# Patient Record
Sex: Female | Born: 1975
Health system: Southern US, Community
[De-identification: ages and names within clinical notes are randomized; demographics above are authoritative.]

## PROBLEM LIST (undated history)

## (undated) ENCOUNTER — Inpatient Hospital Stay (HOSPITAL_COMMUNITY): Payer: Self-pay

## (undated) DIAGNOSIS — N6019 Diffuse cystic mastopathy of unspecified breast: Secondary | ICD-10-CM

## (undated) DIAGNOSIS — F329 Major depressive disorder, single episode, unspecified: Secondary | ICD-10-CM

## (undated) DIAGNOSIS — S025XXA Fracture of tooth (traumatic), initial encounter for closed fracture: Secondary | ICD-10-CM

## (undated) DIAGNOSIS — Z550 Illiteracy and low-level literacy: Secondary | ICD-10-CM

## (undated) DIAGNOSIS — F32A Depression, unspecified: Secondary | ICD-10-CM

## (undated) DIAGNOSIS — R252 Cramp and spasm: Secondary | ICD-10-CM

## (undated) HISTORY — DX: Illiteracy and low-level literacy: Z55.0

## (undated) HISTORY — DX: Major depressive disorder, single episode, unspecified: F32.9

## (undated) HISTORY — PX: UMBILICAL HERNIA REPAIR: SHX196

## (undated) HISTORY — DX: Depression, unspecified: F32.A

---

## 2003-09-11 ENCOUNTER — Inpatient Hospital Stay (HOSPITAL_COMMUNITY): Admission: AD | Admit: 2003-09-11 | Discharge: 2003-09-11 | Payer: Self-pay | Admitting: Obstetrics and Gynecology

## 2003-09-11 ENCOUNTER — Encounter (INDEPENDENT_AMBULATORY_CARE_PROVIDER_SITE_OTHER): Payer: Self-pay | Admitting: *Deleted

## 2004-02-12 HISTORY — PX: CHOLECYSTECTOMY: SHX55

## 2004-05-11 ENCOUNTER — Ambulatory Visit: Payer: Self-pay | Admitting: Internal Medicine

## 2004-07-08 ENCOUNTER — Inpatient Hospital Stay (HOSPITAL_COMMUNITY): Admission: AD | Admit: 2004-07-08 | Discharge: 2004-07-09 | Payer: Self-pay | Admitting: Obstetrics & Gynecology

## 2004-07-14 ENCOUNTER — Inpatient Hospital Stay (HOSPITAL_COMMUNITY): Admission: AD | Admit: 2004-07-14 | Discharge: 2004-07-14 | Payer: Self-pay | Admitting: Obstetrics

## 2004-07-15 ENCOUNTER — Inpatient Hospital Stay (HOSPITAL_COMMUNITY): Admission: AD | Admit: 2004-07-15 | Discharge: 2004-07-18 | Payer: Self-pay | Admitting: Obstetrics

## 2004-07-20 ENCOUNTER — Inpatient Hospital Stay (HOSPITAL_COMMUNITY): Admission: AD | Admit: 2004-07-20 | Discharge: 2004-07-24 | Payer: Self-pay | Admitting: Obstetrics

## 2004-08-01 ENCOUNTER — Inpatient Hospital Stay (HOSPITAL_COMMUNITY): Admission: AD | Admit: 2004-08-01 | Discharge: 2004-08-03 | Payer: Self-pay | Admitting: Obstetrics

## 2004-08-06 ENCOUNTER — Inpatient Hospital Stay (HOSPITAL_COMMUNITY): Admission: AD | Admit: 2004-08-06 | Discharge: 2004-08-09 | Payer: Self-pay | Admitting: Obstetrics

## 2005-01-15 ENCOUNTER — Inpatient Hospital Stay (HOSPITAL_COMMUNITY): Admission: AD | Admit: 2005-01-15 | Discharge: 2005-01-17 | Payer: Self-pay | Admitting: Obstetrics

## 2005-03-31 ENCOUNTER — Inpatient Hospital Stay (HOSPITAL_COMMUNITY): Admission: AD | Admit: 2005-03-31 | Discharge: 2005-03-31 | Payer: Self-pay | Admitting: Obstetrics

## 2005-05-03 ENCOUNTER — Ambulatory Visit (HOSPITAL_COMMUNITY): Admission: RE | Admit: 2005-05-03 | Discharge: 2005-05-03 | Payer: Self-pay | Admitting: Surgery

## 2005-07-01 ENCOUNTER — Ambulatory Visit: Payer: Self-pay | Admitting: Internal Medicine

## 2005-11-06 ENCOUNTER — Ambulatory Visit (HOSPITAL_COMMUNITY): Admission: RE | Admit: 2005-11-06 | Discharge: 2005-11-06 | Payer: Self-pay | Admitting: Family Medicine

## 2006-02-25 ENCOUNTER — Inpatient Hospital Stay (HOSPITAL_COMMUNITY): Admission: AD | Admit: 2006-02-25 | Discharge: 2006-02-27 | Payer: Self-pay | Admitting: Gynecology

## 2006-02-25 ENCOUNTER — Ambulatory Visit: Payer: Self-pay | Admitting: Obstetrics & Gynecology

## 2009-03-07 ENCOUNTER — Emergency Department (HOSPITAL_COMMUNITY): Admission: EM | Admit: 2009-03-07 | Discharge: 2009-03-07 | Payer: Self-pay | Admitting: Emergency Medicine

## 2010-04-30 LAB — URINALYSIS, ROUTINE W REFLEX MICROSCOPIC
Protein, ur: NEGATIVE mg/dL
Specific Gravity, Urine: 1.017 (ref 1.005–1.030)
Urobilinogen, UA: 0.2 mg/dL (ref 0.0–1.0)
pH: 6.5 (ref 5.0–8.0)

## 2010-04-30 LAB — WET PREP, GENITAL: Yeast Wet Prep HPF POC: NONE SEEN

## 2010-04-30 LAB — URINE MICROSCOPIC-ADD ON

## 2010-06-29 NOTE — Op Note (Signed)
NAMEBRADEN, DELOACH        ACCOUNT NO.:  [id number]   MEDICAL RECORD NO.:  [id number]          PATIENT TYPE:  INP   LOCATION:  9168                          FACILITY:  [facility]   PHYSICIAN:  Kathreen Cosier, M.D.DATE OF BIRTH:  1975-09-23   DATE OF PROCEDURE:  01/15/2005  DATE OF DISCHARGE:                                 OPERATIVE REPORT   DELIVERY NOTE:  The patient is 35 year old, gravida 2, para 0-0-1-0, [facility] January 13, 2005.  Was admitted for induction.  The patient progressed rapidly and after being  fully dilated, she pushed for +5 minutes and was tired and requested help.  She pushed the vertex to +3 station.  Midline episiotomy, third degree, was  cut and the vacuum applied through one contraction and one push.  There was  no pop-off.  She then delivered from the LOA position a female, Apgars 9 and  9.  There was a small posterior extension.  Placenta was delivered  spontaneously intact.  The rectal mucosa and the anal sphincter were  repaired along with the episiotomy with 2-0 Vicryl sutures.  Post repair the  rectal mucosa and the anal sphincter were noted to be intact.  The patient  tolerated the procedure well.           ______________________________  Kathreen Cosier, M.D.     BAM/MEDQ  D:  01/15/2005  T:  01/15/2005  Job:  [id number]

## 2010-06-29 NOTE — Discharge Summary (Signed)
NAMEMAGDELINE, PRANGE        ACCOUNT NO.:  [id number]   MEDICAL RECORD NO.:  [id number]          PATIENT TYPE:  INP   LOCATION:  9317                          [facility]ACILITY:  [facility]   PHYSICIAN:  Kathreen Cosier, M.D.DATE OF BIRTH:  May 06, 1975   DATE OF ADMISSION:  07/20/2004  DATE OF DISCHARGE:  07/24/2004                                 DISCHARGE SUMMARY   A second admission for a 35 year old, gravida 2, para 0-0-1-0 who was  hospitalized because of nausea and vomiting. She had a history of  gallbladder polyps and was started on a low fat diet, sent home but came  back with persistent nausea and vomiting. On admission, her potassium was  3.8.  She was started on 14 mEq of Kay Ciel in each IV, and placed on a low-  fat diet. She was seen in consult by general surgery and placed on Protonix  and Benadryl. The patient felt better and was discharged on June 13 on  Protonix, vitamin B6, Benadryl 50 mg p.o. q.6 h on a low fat diet to see me  in six weeks.   DISCHARGE DIAGNOSES:  Status post history of gallbladder disease, [redacted] weeks  pregnant, and hyperemesis.       BAM/MEDQ  D:  08/15/2004  T:  08/15/2004  Job:  [id number]

## 2012-02-12 NOTE — L&D Delivery Note (Signed)
Delivery Note At 2:49 PM a viable and healthy female was delivered via Vaginal, Spontaneous Delivery (Presentation: ; Occiput Anterior).  APGAR: 9, 9; weight pending.   Placenta status: Intact, Spontaneous.  Cord: 3 vessels with the following complications: None.    Anesthesia: Epidural  Episiotomy: None Lacerations: None Suture Repair: none Est. Blood Loss (mL): 300  Mom to postpartum.  Baby to nursery-stable.  [name] 09/13/2012, 2:58 PM

## 2012-04-28 LAB — OB RESULTS CONSOLE RPR: RPR: NONREACTIVE

## 2012-04-28 LAB — OB RESULTS CONSOLE VARICELLA ZOSTER ANTIBODY, IGG: Varicella: NON-IMMUNE/NOT IMMUNE

## 2012-04-28 LAB — GLUCOSE, 1 HOUR: Glucose, 1 hour: 194

## 2012-04-28 LAB — OB RESULTS CONSOLE HGB/HCT, BLOOD
HCT: 33 %
HCT: 33 %
Hemoglobin: 11.3 g/dL
Hemoglobin: 11.3 g/dL

## 2012-04-28 LAB — OB RESULTS CONSOLE PLATELET COUNT
Platelets: 233 10*3/uL
Platelets: 233 10*3/uL

## 2012-04-28 LAB — OB RESULTS CONSOLE ABO/RH: RH Type: POSITIVE

## 2012-04-28 LAB — OB RESULTS CONSOLE HEPATITIS B SURFACE ANTIGEN: Hepatitis B Surface Ag: NEGATIVE

## 2012-04-28 LAB — OB RESULTS CONSOLE ANTIBODY SCREEN: Antibody Screen: NEGATIVE

## 2012-04-29 ENCOUNTER — Other Ambulatory Visit (HOSPITAL_COMMUNITY): Payer: Self-pay | Admitting: Physician Assistant

## 2012-04-29 DIAGNOSIS — Z3689 Encounter for other specified antenatal screening: Secondary | ICD-10-CM

## 2012-05-01 ENCOUNTER — Other Ambulatory Visit (HOSPITAL_COMMUNITY): Payer: Self-pay

## 2012-05-04 ENCOUNTER — Ambulatory Visit (HOSPITAL_COMMUNITY)
Admission: RE | Admit: 2012-05-04 | Discharge: 2012-05-04 | Disposition: A | Discharge status: Home / Self Care | Payer: Self-pay | Source: Ambulatory Visit | Attending: Physician Assistant | Admitting: Physician Assistant

## 2012-05-04 DIAGNOSIS — Z3689 Encounter for other specified antenatal screening: Secondary | ICD-10-CM

## 2012-05-04 DIAGNOSIS — Z363 Encounter for antenatal screening for malformations: Secondary | ICD-10-CM | POA: Insufficient documentation

## 2012-05-04 DIAGNOSIS — O358XX0 Maternal care for other (suspected) fetal abnormality and damage, not applicable or unspecified: Secondary | ICD-10-CM | POA: Insufficient documentation

## 2012-05-04 DIAGNOSIS — Z1389 Encounter for screening for other disorder: Secondary | ICD-10-CM | POA: Insufficient documentation

## 2012-06-01 ENCOUNTER — Encounter: Payer: Self-pay | Attending: Obstetrics & Gynecology | Admitting: Dietician

## 2012-06-01 DIAGNOSIS — O99810 Abnormal glucose complicating pregnancy: Secondary | ICD-10-CM | POA: Insufficient documentation

## 2012-06-01 DIAGNOSIS — Z713 Dietary counseling and surveillance: Secondary | ICD-10-CM | POA: Insufficient documentation

## 2012-06-08 ENCOUNTER — Encounter: Payer: Self-pay | Admitting: *Deleted

## 2012-06-08 ENCOUNTER — Ambulatory Visit: Payer: Self-pay | Admitting: Family Medicine

## 2012-06-08 ENCOUNTER — Other Ambulatory Visit: Payer: Self-pay | Admitting: Obstetrics & Gynecology

## 2012-06-08 VITALS — BP 98/61 | Temp 96.8°F | Ht 59.0 in | Wt 152.2 lb

## 2012-06-08 DIAGNOSIS — O24419 Gestational diabetes mellitus in pregnancy, unspecified control: Secondary | ICD-10-CM

## 2012-06-08 LAB — POCT URINALYSIS DIP (DEVICE)
Bilirubin Urine: NEGATIVE
Hgb urine dipstick: NEGATIVE
Ketones, ur: NEGATIVE mg/dL
pH: 6 (ref 5.0–8.0)

## 2012-06-08 NOTE — Progress Notes (Signed)
Diabetes Education:  Seen today for [facility] instructions along with another Spanish speaking client needing a meter.  Assisted with the interaction by the Spanish interpreter.  Review the need for daily exercise for lowering glucose (30 minutes of walking daily), review of the S/S of hypoglycemia and its treatment.  Provided a True Track meter Lot: [id number]  Exp: 2014/05/02 and 1 box strips LOT: [id number] EXP: 2014/07/16 and 1 box lancets LOT: [id number]  EXP: 2016/10/30.  Provided demonstration of meter use.  On return demonstration, her blood glucose at 11:30 was 175 mg/dl.  She had a late meal in the early morning hours.  Instructed to monitor fasting and 2 hr PP blood glucose levels, record and bring her meter and her glucose log to all clinic appointments.  Maggie Hania Cerone, RN, RD, CDE.

## 2012-06-08 NOTE — Progress Notes (Signed)
HR 72

## 2012-06-08 NOTE — Progress Notes (Signed)
Nutrition note: 1st visit consult Pt has GDM & h/o obesity. Pt has lost 1.8# @ 27w (due 09/06/12 per pt). Pt reports eating 2 meals & 2 snacks/d. Pt is taking PNV. Pt reports no N&V or heartburn. Pt received verbal & written Spanish education on GDM diet. Encouraged energy dense snacks with protein sources. Disc wt gain goals of 11-20# or 0.5#/wk. Pt agrees to follow GDM diet including 3 meals & 3 snacks/d and proper CHO/ protein combination. Pt has WIC & plans to BF. F/u in 2-4 wks to review diet with an interpreter.  Blondell Reveal, MS, RD, LDN

## 2012-06-15 ENCOUNTER — Encounter: Payer: Self-pay | Admitting: Obstetrics and Gynecology

## 2012-06-15 ENCOUNTER — Ambulatory Visit (INDEPENDENT_AMBULATORY_CARE_PROVIDER_SITE_OTHER): Payer: Self-pay | Admitting: Obstetrics and Gynecology

## 2012-06-15 VITALS — BP 111/72 | Temp 98.0°F | Wt 154.1 lb

## 2012-06-15 DIAGNOSIS — E669 Obesity, unspecified: Secondary | ICD-10-CM

## 2012-06-15 DIAGNOSIS — O24419 Gestational diabetes mellitus in pregnancy, unspecified control: Secondary | ICD-10-CM

## 2012-06-15 DIAGNOSIS — Z8659 Personal history of other mental and behavioral disorders: Secondary | ICD-10-CM | POA: Insufficient documentation

## 2012-06-15 DIAGNOSIS — O99212 Obesity complicating pregnancy, second trimester: Secondary | ICD-10-CM | POA: Insufficient documentation

## 2012-06-15 DIAGNOSIS — O99810 Abnormal glucose complicating pregnancy: Secondary | ICD-10-CM

## 2012-06-15 DIAGNOSIS — O99210 Obesity complicating pregnancy, unspecified trimester: Secondary | ICD-10-CM

## 2012-06-15 DIAGNOSIS — O09299 Supervision of pregnancy with other poor reproductive or obstetric history, unspecified trimester: Secondary | ICD-10-CM

## 2012-06-15 LAB — POCT URINALYSIS DIP (DEVICE)
Hgb urine dipstick: NEGATIVE
Protein, ur: NEGATIVE mg/dL
Specific Gravity, Urine: >=1.03 (ref 1.005–1.030)
Urobilinogen, UA: 1 mg/dL (ref 0.0–1.0)

## 2012-06-15 NOTE — Progress Notes (Signed)
Patient transferred care from health department secondary to gestational diabetes. Patient has been checking sugars sporadically and admits that she is not sure how to check. Education provided again. FM/PTL precautions reviewed.

## 2012-06-15 NOTE — Progress Notes (Signed)
Pulse: 72

## 2012-06-15 NOTE — Assessment & Plan Note (Signed)
Diabetes education today.

## 2012-06-22 ENCOUNTER — Ambulatory Visit (INDEPENDENT_AMBULATORY_CARE_PROVIDER_SITE_OTHER): Payer: Self-pay | Admitting: Family

## 2012-06-22 VITALS — BP 108/72 | Temp 97.0°F

## 2012-06-22 DIAGNOSIS — O09299 Supervision of pregnancy with other poor reproductive or obstetric history, unspecified trimester: Secondary | ICD-10-CM

## 2012-06-22 DIAGNOSIS — O24419 Gestational diabetes mellitus in pregnancy, unspecified control: Secondary | ICD-10-CM

## 2012-06-22 DIAGNOSIS — O99810 Abnormal glucose complicating pregnancy: Secondary | ICD-10-CM

## 2012-06-22 LAB — CBC
MCH: 31.9 pg (ref 26.0–34.0)
MCHC: 34.1 g/dL (ref 30.0–36.0)
Platelets: 263 10*3/uL (ref 150–400)

## 2012-06-22 LAB — POCT URINALYSIS DIP (DEVICE)
Hgb urine dipstick: NEGATIVE
Nitrite: NEGATIVE
Protein, ur: NEGATIVE mg/dL
Urobilinogen, UA: 0.2 mg/dL (ref 0.0–1.0)
pH: 5.5 (ref 5.0–8.0)

## 2012-06-22 LAB — RPR

## 2012-06-22 MED ORDER — GLYBURIDE 2.5 MG PO TABS: 2.5000 mg | ORAL_TABLET | Freq: Every day | ORAL | Status: DC

## 2012-06-22 NOTE — Progress Notes (Signed)
Pulse- 71  Contractions started yesterday and then stopped

## 2012-06-22 NOTE — Progress Notes (Signed)
Pt having difficulty recording blood sugars, cannot read; put dates in log for patient (did not know how to write dates); based on her explanation of numbers FBS 118-124; Brk 111-205 (5/6 abnl); lunch 108-171 (3/4 abnl); dinner not tracking (was putting multiple weeks on same page); explained importance of checking blood sugars and implication on fetal health if poor control, including stillbirth. Begin 2.5mg  glyburide hs.

## 2012-06-29 ENCOUNTER — Ambulatory Visit (INDEPENDENT_AMBULATORY_CARE_PROVIDER_SITE_OTHER): Payer: Self-pay | Admitting: Obstetrics & Gynecology

## 2012-06-29 VITALS — BP 105/66 | Temp 97.0°F | Wt 151.9 lb

## 2012-06-29 DIAGNOSIS — O24419 Gestational diabetes mellitus in pregnancy, unspecified control: Secondary | ICD-10-CM

## 2012-06-29 DIAGNOSIS — O99212 Obesity complicating pregnancy, second trimester: Secondary | ICD-10-CM

## 2012-06-29 DIAGNOSIS — E669 Obesity, unspecified: Secondary | ICD-10-CM

## 2012-06-29 DIAGNOSIS — Z23 Encounter for immunization: Secondary | ICD-10-CM

## 2012-06-29 DIAGNOSIS — O99810 Abnormal glucose complicating pregnancy: Secondary | ICD-10-CM

## 2012-06-29 LAB — POCT URINALYSIS DIP (DEVICE)
Leukocytes, UA: NEGATIVE
Nitrite: NEGATIVE
Protein, ur: NEGATIVE mg/dL
pH: 5.5 (ref 5.0–8.0)

## 2012-06-29 MED ORDER — TETANUS-DIPHTH-ACELL PERTUSSIS 5-2.5-18.5 LF-MCG/0.5 IM SUSP
0.5000 mL | Freq: Once | INTRAMUSCULAR | Status: AC
  Administered 2012-06-29: 0.5 mL via INTRAMUSCULAR

## 2012-06-29 MED ORDER — METFORMIN HCL 500 MG PO TABS: 500.0000 mg | ORAL_TABLET | Freq: Two times a day (BID) | ORAL | Status: DC

## 2012-06-29 NOTE — Patient Instructions (Addendum)
Return to clinic for any obstetric concerns or go to MAU for evaluation Vacuna difteria/ttanos (Td) o Vacuna difteria, ttanos, tos convulsa (Tdap), Lo que debe saber (Tetanus, Diphtheria [Td] or Tetanus, Diphtheria, Pertussis [Tdap] Vaccine, What You Need to Know) PORQU VACUNARSE? El ttanos , la difteria y la tos ferina pueden ser enfermedades graves.  El TTANOS  (trismo) provoca la contraccin dolorosa y rigidez de los msculos, por lo general, en todo el cuerpo.   Puede causar la contraccin de los msculos de la cabeza y el cuello de modo que el enfermo no puede abrir la boca ni tragar., y en algunos casos, tampoco puede respirar.. El ttanos causa la muerte de 1 de cada 5 personas que se infectan. LA DIFTERIA produce la formacin de una membrana gruesa que cubre el fondo de la garganta.  Puede causar problemas respiratorios, parlisis, insuficiencia cardaca, e incluso la muerte. El PERTUSIS (tos ferina) causa ataques de tos intensa que pueden dificultar la respiracin, provocar vmitos e interrumpir el sueo.   Puede causar prdida de peso, incontinencia, fractura de costillas, y desmayos por la intensa tos. Hasta de 2 de cada 100 adolescentes y 5 de cada 100 adultos que enferman de tos ferina deben ser hospitalizados o tienen complicaciones como la neumona y la muerte. Estas 3 enfermedades son provocadas por bacterias. La difteria y la tos ferina se contagian de persona a persona. El ttanos ingresa al organismo a travs de cortes, rasguos o heridas. En los [place] ocurran alrededor de 200 000 casos por ao de difteria y tos ferina, antes de que existieran las vacunas, y tambin ocurran cientos de casos de ttanos. Desde la aparicin de las vacunas, el ttanos y la difteria han disminuido en alrededor del 99% y los casos de tos ferina disminuyeron aproximadamente el 92%.  Los nios menores de 6 aos deben recibir la vacuna DTaP para estar protegidos contra estas tres  enfermedades. Pero los nios mayores, los adolescentes y los adultos tambin necesitan proteccin. VACUNAS PARA ADOLESCENTES Y ADULTOS Vacunas Tdap y Td  Hay dos vacunas disponibles para proteger de estas enfermedades a nios a partir de los 7aos:   La vacuna Td fue utilizada durante muchos aos. Protege contra el ttanos y la difteria.  La vacuna Tdap fue autorizada en 2005. Es la primera vacuna para adolescentes y adultos que protege contra la tos ferina y el ttanos y la difteria. Una dosis de refuerzo de la Td se recomienda cada 10 aos. La Tdap se aplica slo una vez.  QU VACUNA DEBO APLICARME Y CUANDO? Las edades de 7 a 18 aos  Entre los 11 y los 12 aos se recomienda una dosis de Tdap. Esta dosis puede aplicarse desde los 7 aos en los nios que no han recibido una o ms dosis de DTaP anteriormente.  Los nios y adolescentes que no recibieron todas las dosis programadas de DTaP o DTP a los 7 aos deben completar la serie usando una combinacin de Td y Tdap. Adultos de 19 aos o ms  Todos los adultos deben recibir una dosis de refuerzo de Td cada 10 aos. Los adultos de menos de 65 aos que nunca hayan recibido la Tdap deben reemplazarla por la siguiente dosis de refuerzo. Los adultos a partir de los 65 aos puedenrecibir una dosis de Tdap.  Los adultos (incluyendo las mujeres que podran quedar embarazadas y los adultos mayores de 65 aos) que tienen contacto cercano con un beb menor de 12 meses deben aplicarse una dosis de   Tdap para proteger al beb de la tos ferina.  Los trabajadores de la salud que tengan contacto directo con pacientes en hospitales o clnicas deben recibir una dosis de Tdap. Proteccin despus de una herida  Es posible que una persona que tenga un corte o quemadura grave necesite una dosis de Td o Tdap para prevenir la infeccin por ttanos. Puede usarse la Tdap en personas que nunca recibieron una dosis. Pero debe usarse la Td, si la Tdap no se encuentra  disponible, o para:  Cualquier persona que haya recibido una dosis de Tdap.  Los nios entre los 7 y los 9 aos que han completado las series de DTap anteriormente.  Adultos de 65 aos o ms. Mujeres embarazadas.   Las mujeres embarazadas que nunca recibieron una dosis de Ddap deben recibirla despus de la 20a semana de gestacin y preferiblemente durante el 3er. trimestre. Si no se aplican la Tdap durante el embarazo, deben recibirla lo antes posible despus del parto. Las mujeres embarazadas que han recibido la Tdap y tienen que aplicarse la vacuna contra el ttanos o la difteria durante el embarazo, deben recibir la Td. Las vacunas Tdap y Td pueden ser administradas al mismo tiempo que otras vacunas. ALGUNAS PERSONAS NO DEBEN RECIBIR LA VACUNA O DEBEN ESPERAR  Las personas que hayan tenido una reaccin alrgica que haya puesto en peligro su vida despus de una dosis de vacuna contra el ttanos, la difteria o la tos ferina no deben recibir Td ni Tdap..  Las personas que tengan alergias graves a algn componente de una vacuna no deben recibir esa vacuna. Informe a su mdico si la persona que recibe la vacuna sufre alergias graves.  Cualquier persona que haya estado en coma o que haya tenido convulsiones dentro de los 7 das posteriores despus de una dosis de DTP o DTaP no debe recibir la Tdap, salvo que se encuentre una causa que no fuera la vacuna. Estas personas pueden recibir Td.  Consulte a su mdico si la persona que recibe alguna de las vacunas:  Tiene epilepsia o algn otro problema del sistema nervioso.  Tuvo inflamacin o dolor intenso despus de una dosis de DTP, DTaP, DT, Td, o Tdap.  Ha tenido el sndrome de Guillain Barr (GBS por sus siglas en ingls). Las personas que sufran una enfermedad moderada o grave el da en que se programa la vacuna, deben esperar a recuperarse para recibir las vacunas Tdap o Td. Por lo general, una persona con una enfermedad leve o fiebre baja  puede recibir la vacuna. CULES SON LOS RIESGOS DE LAS VACUNAS TDAP Y TD? Con una vacuna, al igual que con cualquier medicamento, siempre existe un pequeo riesgo de una reaccin alrgica que ponga en peligro la vida o cause otro problema grave. Todo procedimiento mdico, inclusive la vacunacin pueden causar breves episodios de lipotimia o sntomas relacionados (como movimientos espasmdicos). Para evitar los desmayos y las lesiones causadas por las cadas, permanezca sentado o recustese durante los 15 minutos posteriores a la vacunacin. Informe a su mdico si el paciente se siente dbil o mareado, tiene cambios en la visin o siente zumbidos en los odos.  Es mucho ms probable que tener ttanos, difteria, o tos ferina cause problemas ms graves que los provocados por recibir cualquiera de las vacunas Td o Tdap. A continuacin se enumeran los problemas informados despus de las vacunas Td y Tdap. Problemas Leves (perceptibles, pero que no interfirieron con las actividades): Tdap  Dolor (alrededor de 3 de cada   4 adolescentes y 2 de cada 3 adultos).  Enrojecimiento o inflamacin en el sitio de la inyeccin (alrededor de 1 de cada 5).  Fiebre leve de al menos 100.4 F (38 C) (hasta alrededor de 1 cada 25 adolescentes y 1 de cada 100 adultos).  Dolor de cabeza (alrededor de 4 de cada 10 adolescentes y 3 de cada 10 adultos).  Cansancio (alrededor de 1 de cada 3 adolescentes y 1 de cada 4 adultos).  Nuseas, vmitos, diarrea, o dolor de estmago (hasta 1 de cada 4 adolescentes y 1 de cada 10 adultos).  Escalofros, dolores corporales, dolor articular, erupciones, o inflamacin de las glndulas (poco frecuente). Td  Dolor (hasta alrededor de 8 de cada 10).  Enrojecimiento o inflamacin de la inyeccin (alrededor de 1 de cada 3).  Fiebre leve (hasta alrededor de 1 de cada 5).  Dolor de cabeza o cansancio (poco frecuente). Problemas Moderados (interfieren con las actividades, pero no  requieren atencin mdica): Tdap  Dolor en el sitio de la inyeccin (alrededor de 1 de cada 20 adolescentes y 1 de cada 100 adultos).  Enrojecimiento o inflamacin de la inyeccin (alrededor de 1 de cada 16 adolescentes y 1 de cada 25 adultos).  Fiebre de ms de 102 F (38.9 C) (alrededor de 1 de cada 100 adolescentes y 1 de cada 250 adultos).  Dolor de cabeza (1 de cada 300).  Nuseas, vmitos, diarrea, o dolor de estmago (hasta 3 de cada 100 adolescentes y 1 de cada 100 adultos). Td  Fiebre de ms de 102 F (38.9 C) (poco comn). Tdap o Td  Inflamacin de gran extensin en el brazo en el que se aplic la vacuna (hasta 3 de cada 100). Problemas Graves (no puede realizar actividades habituales; requiere atencin mdica) Tdap o Td  Inflamacin, dolor intenso, sangrado y enrojecimiento en el brazo, en el sitio de la inyeccin (poco frecuente). Puede producirse una reaccin alrgica grave despus de cualquier vacuna. Se estima que estas reacciones ocurren en menos de una de cada un milln de dosis. QU PASA SI HAY UNA REACCIN GRAVE? Qu signos debo buscar? Cualquier estado poco habitual, como una reaccin alrgica grave o fiebre alta. Si le produce una reaccin alrgica grave, se manifestar dentro de algunos minutos a una hora despus de recibir la vacuna. Entre los signos de reaccin alrgica grave se encuentran la dificultad para respirar, debilidad, ronquera o sibilancias, latidos cardacos acelerados, urticaria, mareos, palidez, o inflamacin de la garganta. Qu debo hacer?  Comunquese con su mdico o lleve inmediatamente a la persona al mdico.  Dgale a su mdico qu ocurri, la fecha y hora en que sucedi y cundo le aplicaron la vacuna.  Pida a su mdico que informe sobre la reaccin llenando un formulario del Sistema de Informacin de Reacciones Adversos a las Vacunas (VAERS, por sus siglas en ingls). O, puede presentar este informe a travs del sitio web de VAERS  enwww.vaers.hhs.gov o puede llamar al 1-800-822-7967. VAERS no brinda asistencia mdica. PROGRAMA NACIONAL DE COMPENSACIN DE DAOS POR VACUNAS El Programa Nacional de Compensacin de Daos por Vacunas (VICP) fue creado en 1986.  Aquellas personas que consideren que han sufrido un dao como consecuencia de una vacuna y quieren saber ms acerca del programa y como presentar una denuncia, pueden llamar al 1-800-338-2382 o visitar su sitio web en www.hrsa.gov/vaccinecompensation  CMO PUEDO OBTENER MS INFORMACIN?  El profesional podr darle el prospecto de la vacuna o sugerirle otras fuentes de informacin.  Comunquese con el servicio de salud   de su localidad o su estado.  Comunquese con los Centros para el control y la prevencin de enfermedades (Centers for Disease Control and Prevention , CDC).  Llame al 1-800-232-4636 (1-800-CDC-INFO).  Visite los sitios web de CDC ubicados en www.cdc.gov/vaccines CDC Td and Tdap Interim VIS-Spanish (03/06/10) Document Released: 05/16/2008 Document Revised: 04/22/2011 ExitCare Patient Information 2013 [employer].  

## 2012-06-29 NOTE — Progress Notes (Signed)
Patient is Spanish-speaking only, Spanish interpreter present for this encounter. Counseled about Tdap vaccine, patient will get this today.  CBGs all elevated fastings 91-124; postprandials 91-180.  She did not take Glyburide secondary to side effects. Will try Metformin, see if this helps with her CBGs.  If patient does not tolerate Metformin, will have to use insulin.  No other complaints or concerns.  Fetal movement and labor precautions reviewed.

## 2012-06-29 NOTE — Progress Notes (Signed)
Pulse- 70 Patient reports that she had to stop taking the glyburide after three nights because she experienced diarrhea and numbness from the waist down and swelling every time she took the medication

## 2012-07-13 ENCOUNTER — Encounter (HOSPITAL_COMMUNITY): Payer: Self-pay

## 2012-07-13 ENCOUNTER — Ambulatory Visit (INDEPENDENT_AMBULATORY_CARE_PROVIDER_SITE_OTHER): Payer: Self-pay | Admitting: Family

## 2012-07-13 ENCOUNTER — Inpatient Hospital Stay (HOSPITAL_COMMUNITY)
Admission: AD | Admit: 2012-07-13 | Discharge: 2012-07-13 | Disposition: A | Discharge status: Home / Self Care | Payer: Self-pay | Source: Ambulatory Visit | Attending: Obstetrics & Gynecology | Admitting: Obstetrics & Gynecology

## 2012-07-13 DIAGNOSIS — O368130 Decreased fetal movements, third trimester, not applicable or unspecified: Secondary | ICD-10-CM

## 2012-07-13 DIAGNOSIS — Z3689 Encounter for other specified antenatal screening: Secondary | ICD-10-CM

## 2012-07-13 DIAGNOSIS — O368190 Decreased fetal movements, unspecified trimester, not applicable or unspecified: Secondary | ICD-10-CM

## 2012-07-13 DIAGNOSIS — O24419 Gestational diabetes mellitus in pregnancy, unspecified control: Secondary | ICD-10-CM

## 2012-07-13 DIAGNOSIS — O99810 Abnormal glucose complicating pregnancy: Secondary | ICD-10-CM

## 2012-07-13 LAB — POCT URINALYSIS DIP (DEVICE)
Hgb urine dipstick: NEGATIVE
Protein, ur: NEGATIVE mg/dL
Specific Gravity, Urine: >=1.03 (ref 1.005–1.030)
pH: 6.5 (ref 5.0–8.0)

## 2012-07-13 NOTE — MAU Note (Signed)
Pt states decreased fetal movement for 2 days

## 2012-07-13 NOTE — MAU Provider Note (Signed)
History     CSN: [id number]  Arrival date and time: 07/13/12 0942   None     Chief Complaint  Patient presents with  . Decreased Fetal Movement   HPI 37 y.o. [id number] at [redacted]w[redacted]d with decreased fetal movement x 2 days. Still having regular movement, just less than usual. No pain or bleeding.    Past Medical History  Diagnosis Date  . Umbilical hernia 2008  . H/O postpartum depression, currently pregnant   . Abnormal Pap smear 11/05/2005    ASCUS; paps thereafter normal  . History of gallstones     Past Surgical History  Procedure Laterality Date  . Dilation and curettage of uterus  2001  . Hernia repair      No family history on file.  History  Substance Use Topics  . Smoking status: Not on file  . Smokeless tobacco: Not on file  . Alcohol Use: Not on file    Allergies: No Known Allergies  Prescriptions prior to admission  Medication Sig Dispense Refill  . Prenatal Vit-Fe Fumarate-FA (PRENATAL MULTIVITAMIN) TABS Take 1 tablet by mouth daily at 12 noon.        Review of Systems  Constitutional: Negative.   Respiratory: Negative.   Cardiovascular: Negative.   Gastrointestinal: Negative for nausea, vomiting, abdominal pain, diarrhea and constipation.  Genitourinary: Negative for dysuria, urgency, frequency, hematuria and flank pain.       Negative for vaginal bleeding, cramping/contractions  Musculoskeletal: Negative.   Neurological: Negative.   Psychiatric/Behavioral: Negative.    Physical Exam   Blood pressure 109/62, pulse 74, temperature 97.3 F (36.3 C), temperature source Oral, resp. rate 16, unknown if currently breastfeeding.  Physical Exam  Nursing note and vitals reviewed. Constitutional: She is oriented to person, place, and time. She appears well-developed and well-nourished. No distress.  Cardiovascular: Normal rate.   Respiratory: Effort normal.  GI: Soft. There is no tenderness.  Musculoskeletal: Normal range of motion.  Neurological: She  is alert and oriented to person, place, and time.  Skin: Skin is warm and dry.  Psychiatric: She has a normal mood and affect.   FHR: 140 bpm, variability: moderate,  accelerations:  Present,  decelerations:  Present early x 1 2 contractions in > 1 hour on monitor  MAU Course  Procedures Results for orders placed in visit on 07/13/12 (from the past 24 hour(s))  POCT URINALYSIS DIP (DEVICE)     Status: Abnormal   Collection Time    07/13/12  9:24 AM      Result Value Range   Glucose, UA 500 (*) NEGATIVE mg/dL   Bilirubin Urine NEGATIVE  NEGATIVE   Ketones, ur TRACE (*) NEGATIVE mg/dL   Specific Gravity, Urine >=1.030  1.005 - 1.030   Hgb urine dipstick NEGATIVE  NEGATIVE   pH 6.5  5.0 - 8.0   Protein, ur NEGATIVE  NEGATIVE mg/dL   Urobilinogen, UA 0.2  0.0 - 1.0 mg/dL   Nitrite NEGATIVE  NEGATIVE   Leukocytes, UA NEGATIVE  NEGATIVE     Assessment and Plan   1. Decreased fetal movement in pregnancy in third trimester, antepartum   2. NST (non-stress test) reactive   Kick counts and precautions rev'd    Medication List    TAKE these medications       prenatal multivitamin Tabs  Take 1 tablet by mouth daily at 12 noon.            Follow-up Information   Follow up with [facility] On 07/20/2012. (as scheduled)    Contact information:   [address] [place] [address] [phone]        Enzio Buchler 07/13/2012, 11:02 AM

## 2012-07-13 NOTE — Progress Notes (Signed)
Pt c/o not being able to eat supper in the evening due to fetal movement all night keeping her awake. Diet guidelines regarding no fruit in the am as she has been eating with cereal in the am. Requested her to split up her meals in small meals and snacks.  Reviewed portion control and to eat a light dinner no later than 5pm using the foods she likes to eat. Fasting glucose 112-115 mg/dL. Pt will get her Metformin today as she will then have the finances to buy at [facility]. Will wait for next visit to review after having taken her Metformin. 15 and 30 day average cbg  112-115 mg/dl

## 2012-07-13 NOTE — Progress Notes (Signed)
Not seen by me during the visit; was told nurse visit only.  Need to schedule another appt to see provider.

## 2012-07-13 NOTE — MAU Provider Note (Signed)
Attestation of Attending Supervision of Advanced Practitioner (CNM/NP): Evaluation and management procedures were performed by the Advanced Practitioner under my supervision and collaboration. I have reviewed the Advanced Practitioner's note and chart, and I agree with the management and plan.  Abra Lingenfelter H. 7:03 PM

## 2012-08-17 ENCOUNTER — Ambulatory Visit (INDEPENDENT_AMBULATORY_CARE_PROVIDER_SITE_OTHER): Payer: Self-pay | Admitting: Family Medicine

## 2012-08-17 DIAGNOSIS — O24419 Gestational diabetes mellitus in pregnancy, unspecified control: Secondary | ICD-10-CM

## 2012-08-17 DIAGNOSIS — O99810 Abnormal glucose complicating pregnancy: Secondary | ICD-10-CM

## 2012-08-17 DIAGNOSIS — O09299 Supervision of pregnancy with other poor reproductive or obstetric history, unspecified trimester: Secondary | ICD-10-CM

## 2012-08-17 LAB — POCT URINALYSIS DIP (DEVICE)
Hgb urine dipstick: NEGATIVE
Protein, ur: NEGATIVE mg/dL
Specific Gravity, Urine: 1.015 (ref 1.005–1.030)
Urobilinogen, UA: 0.2 mg/dL (ref 0.0–1.0)

## 2012-08-17 NOTE — Progress Notes (Signed)
Has not been in since 6/2.  Now 35 wks.  Brings no book today, out of strips since last appointment.  Needs U/S for growth in 3 wks.  Will start 2x/wk testing. Cultures next week. NST reviewed and reactive.

## 2012-08-17 NOTE — Progress Notes (Signed)
Pulse- 81  Edema-feet  Pain/pressure- lower bad

## 2012-08-17 NOTE — Patient Instructions (Signed)
Diabetes mellitus gestacional  (Gestational Diabetes Mellitus) La diabetes mellitus gestacional, ms comnmente conocida como diabetes gestacional es un tipo de diabetes que desarrollan algunas mujeres durante el embarazo. En la diabetes gestacional, el pncreas no produce suficiente insulina (una hormona), las clulas son menos sensibles a la insulina que se produce (resistencia a la insulina), o ambos.Normalmente, la insulina mueve los azcares de los alimentos a las clulas de los tejidos. Las clulas de los tejidos utilizan los azcares para obtener energa. La falta de insulina o la falta de una respuesta normal a la insulina hace que el exceso de azcar se acumule en la sangre en lugar de penetrar en las clulas de los tejidos. Como resultado, se desarrollan los niveles altos de azcar en la sangre (hiperglucemia). El efecto de los niveles altos de azcar (glucosa) puede causar muchas complicaciones.  FACTORES DE RIESGO Usted tiene mayor probabilidad de desarrollar diabetes gestacional si tiene antecedentes familiares de diabetes y tambin si tiene uno o ms de los siguientes factores de riesgo:   ndice de masa corporal superior a 30 (obesidad).  Embarazo previo con diabetes gestacional.  Mayor edad en el momento del embarazo. Si se mantienen los niveles de glucosa en sangre en un rango normal durante el embarazo, las mujeres pueden tener un embarazo saludable. Si no controla bien sus niveles de glucosa en sangre, pueden tener tener riesgos usted, su beb antes de nacer (feto), el trabajo de parto y el parto, o el beb recin nacido.  SNTOMAS  Si se presentan sntomas, stos son similares a los sntomas que normalmente experimentar durante el embarazo. Los sntomas de la diabetes gestacional son:   Aumento de la sed (polidipsia).  Aumento de ganas de orinar (poliuria).  Aumento de ganas de orinar durante la noche (nicturia).  Prdida de peso. Prdida de peso que puede ser muy  rpida.  Infecciones frecuentes y recurrentes.  Cansancio (fatiga).  Debilidad.  Cambios en la visin, como visin borrosa.  Olor a fruta en el aliento.  Dolor abdominal. DIAGNSTICO La diabetes se diagnostica cuando hay aumento de los niveles de glucosa en la sangre. El nivel de glucosa en la sangre puede controlarse en uno o ms de los siguientes anlisis de sangre:   Medicin de glucosa en sangre en ayunas. No deber comer durante al menos 8 horas antes de que se tome la muestra de sangre.  Anlisis al azar de glucosa en sangre. El nivel de glucosa en sangre se controla en cualquier momento del da sin importar el momento en que haya comido.  Pruebas de glucosa de sangre de hemoglobina A1c. Un anlisis de la hemoglobina A1c proporciona informacin sobre el control de la glucosa en la sangre durante los ltimos 3 meses.  Prueba oral de tolerancia a la glucosa (SOG). La glucosa en la sangre se mide despus de no haber comido (ayuno) durante 1-3 horas y despus de beber una bebida que contiene glucosa. Dado que las hormonas que causan la resistencia a la insulina son ms altas alrededor de las semanas 24-28 de embarazo, generalmente se realiza un SOG durante ese tiempo. Si tiene factores de riesgo para la diabetes gestacional, su mdico puede detectarla antes de las 24 semanas de embarazo. TRATAMIENTO   Usted tendr que tomar medicamentos para la diabetes o insulina diariamente para mantener los niveles de glucosa en sangre en el rango deseado.  Usted tendr que hacer coincidir la dosis de insulina con el ejercicio y la eleccin de alimentos saludables. El objetivo del tratamiento es   mantener el nivel de glucosa en sangre en 60-99 mg / dl antes de la comida (preprandial), a la hora de acostarse y durante la noche mientras dure su embarazo. El objetivo del tratamiento es mantener el mayor pico de azcar en sangre despus de la comida (glucosa postprandial) en 100-140 mg/dl.  INSTRUCCIONES  PARA EL CUIDADO EN EL HOGAR   Haga que su nivel de hemoglobina A1c sea verificado dos veces al ao.  Realice un control diario de glucosa en sangre segn las indicaciones de su mdico. Es comn realizar controles con frecuencia de la glucosa en sangre.  Supervise las cetonas en la orina cuando est enferma y segn las indicaciones de su mdico.  Tome su medicamento para la diabetes y la insulina segn las indicaciones de su mdico para mantener el nivel de glucosa en sangre en su rango deseado.  Nunca se quede sin medicamentos para la diabetes o sin insulina. Es necesario recibirla todos los das.  Ajuste la insulina sobre la base de la ingesta de hidratos de carbono. Los hidratos de carbono pueden aumentar los niveles de glucosa en la sangre, pero deben incluirse en su dieta. Los hidratos de carbono aportan vitaminas, minerales y fibra que son una parte esencial de una dieta saludable. Los hidratos de carbono se encuentran en frutas, verduras, granos enteros, productos lcteos, legumbres y alimentos que contienen azcares aadidos.    Consuma alimentos saludables. Alterne 3 comidas con 3 colaciones.  Mantenga un aumento de peso saludable. El aumento del peso total vara de acuerdo con el ndice de masa corporal antes del embarazo ([facility]).  Lleve una tarjeta de alerta mdica o lleve una pulsera de alerta mdica.  Lleve consigo un refrigerio de 15 gramos de hidrato de carbonos en todo momento para controlar los niveles bajos de glucosa en sangre (hipoglucemia). Algunos ejemplos de aperitivos de 15 gramos de hidratos de carbono son:  Tabletas de glucosa, 3 o 4   Gel de glucosa, tubo de 15 gramos  Pasas de uva, 2 cucharadas (24 g)  Caramelos de goma, 6  Galletas de animales, 8  Jugo de fruta, soda regular, o leche baja en grasa, 4 onzas (120 ml)  Pastillas gomitas, 9    Reconocer la hipoglucemia. Durante el embarazo la hipoglucemia se produce cuando hay niveles de glucosa en  sangre de 60 mg/dl o menos. El riesgo de hipoglucemia aumenta durante el ayuno o saltarse las comidas, durante o despus del ejercicio intenso, y durante el sueo. Los sntomas de hipoglucemia son:  Temblores o sacudidas.  Disminucin de capacidad de concentracin.  Sudoracin.  Aumento en el ritmo cardaco  Dolor de cabeza.  Boca seca.  Hambre.  Irritabilidad.  Ansiedad.  Sueo agitado.  Alteracin del habla o de la coordinacin.  Confusin.  Tratar la hipoglucemia rpidamente. Si usted est alerta y puede tragar con seguridad, siga la regla de 15:15:  Tome de 15 a 20 gramos de glucosa de accin rpida o hidratos de carbono . Las opciones de accin rpida son un gel de glucosa, unas tabletas de glucosa, o 4 onzas (120 ml) de jugo de frutas, soda regular, o leche baja en grasa.  Compruebe su nivel de glucosa en sangre 15 minutos despus de tomar la glucosa.   Tome de 15 a 20 gramos ms de glucosa si al repetir el nivel de glucosa en la sangre todava es de 70 mg / dl o inferior.  Coma una comida o una colacin dentro de 1 hora una vez que los   niveles de glucosa en la sangre vuelven a la normalidad.  Est alerta a la poliuria y polidipsia, que son los primeros signos de hiperglucemia. El conocimiento temprano de la hiperglucemia permite un tratamiento oportuno. Controle la hiperglucemia segn le indic su mdico.  Haga actividad fsica por lo menos 30 minutos al da o como lo indique su mdico. Se recomienda diez minutos de actividad fsica cronometrados 30 minutos despus de cada comida para controlar los niveles de glucosa en sangre postprandial.  Ajuste su dosis de insulina y la ingesta de alimentos, segn sea necesario, si se inicia un nuevo ejercicio o deporte.  Siga su plan diario de enfermo en algn momento que no puede comer o beber como de costumbre.  Evite el tabaco y el alcohol.  Concurra regularmente a las visitas de control con el mdico.  Siga el consejo  de su mdico respecto a los controles prenatales y posteriores al parto (post-parto), a la planificacin de las comidas, al ejercicio, a los medicamentos, a las vitaminas, a los anlisis de sangre, a otras pruebas mdicas y fsicas.  Cuide diariamente la piel y los pies. Examine su piel y los pies diariamente para detectar cortes, moretones, enrojecimiento, problemas en las uas, sangrado, ampollas o llagas.  Cepllese los dientes y encas por lo menos dos veces al da y use hilo dental al menos una vez por da. Concurra regularmente a las visitas de control con el dentista.  Programe un examen de vista durante el primer trimestre de su embarazo o como lo indique su mdico.  Comparta su plan de control de diabetes en su trabajo o en la escuela.  Mantngase al da con las vacunas.  Aprenda a manejar el estrs.  Obtenga educacin continuada y ayuda para la diabetes cuando sea necesario. SOLICITE ATENCIN MDICA SI:   No puede comer alimentos o beber por ms de 6 horas.  Tiene nuseas o ha vomitado durante ms de 6 horas.  Tiene un nivel de glucosa en sangre de 200 mg/dl y cetonas en la orina.  Presenta algn cambio en el estado mental.  Tiene problemas de visin.  Sufre un dolor persistente de cabeza.  Tiene dolor o molestias en el abdomen.  Desarrolla una enfermedad grave adicional.  Tiene diarrea durante ms de 6 horas.  Ha estado enferma o ha tenido fiebre durante un par de das y no mejora. SOLICITE ATENCIN MDICA DE INMEDIATO SI:   Tiene dificultad para respirar.  Ya no siente los movimientos del beb.  Est sangrando o tiene flujo vaginal.  Comienza a tener contracciones o trabajo de parto prematuro. ASEGRESE DE QUE:  Comprende estas instrucciones.  Controlar su enfermedad.  Solicitar ayuda de inmediato si no mejora o si empeora. Document Released: 11/07/2004 Document Revised: 10/23/2011 [employer] Patient Information 2014 [employer].  Lactancia  materna  (Breastfeeding)  El cambio hormonal durante el embarazo produce el desarrollo del tejido mamario y un aumento en el nmero y tamao de los conductos galactforos. La hormona prolactina permite que las protenas, los azcares y las grasas de la sangre produzcan la leche materna en las glndulas productoras de leche. La hormona progesterona impide que la leche materna sea liberada antes del nacimiento del beb. Despus del nacimiento del beb, su nivel de progesterona disminuye permitiendo que la leche materna sea liberada. Pensar en el beb, as como la succin o el llanto, pueden estimular la liberacin de leche de las glndulas productoras de leche.  La decisin de amamantar (lactar) es una de las mejores   opciones que usted puede hacer para usted y su beb. La informacin que sigue da una breve resea de los beneficios, as como otras caractersticas importantes que debe saber sobre la lactancia materna.  LOS BENEFICIOS DE AMAMANTAR  Para el beb   La primera leche (calostro) ayuda al mejor funcionamiento del sistema digestivo del beb.   La leche tiene anticuerpos que provienen de la madre y que ayudan a prevenir las infecciones en el beb.   El beb tiene una menor incidencia de asma, alergias y del sndrome de muerte sbita del lactante (SMSL).   Los nutrientes de la leche materna son mejores para el beb que la leche maternizada.  La leche materna mejora el desarrollo cerebral del beb.   Su beb tendr menos gases, clicos y estreimiento.  Es menos probable que el beb desarrolle otras enfermedades, como obesidad infantil, asma o diabetes mellitus. Para usted   La lactancia materna favorece el desarrollo de un vnculo muy especial entre la madre y el beb.   Es ms conveniente, siempre disponible, a la temperatura adecuada y econmica.   La lactancia materna ayuda a quemar caloras y a perder el peso ganado durante el embarazo.   Hace que el tero se contraiga ms  rpidamente a su tamao normal y disminuye el sangrado despus del parto.   Las madres que amamantan tienen menos riesgo de desarrollar osteoporosis o cncer de mama o de ovario en el futuro.  FRECUENCIA DEL AMAMANTAMIENTO   Un beb sano, nacido a trmino, puede amamantarse con tanta frecuencia como cada hora, o espaciar las comidas cada tres horas. La frecuencia en la lactancia varan de un beb a otro.   Los recin nacidos deben ser alimentados por lo menos cada 2-3 horas durante el da y cada 4-5 horas durante la noche. Usted debe amamantarlo un mnimo de 8 tomas en un perodo de 24 horas.  Despierte al beb para amamantarlo si han pasado 3-4 horas desde la ltima comida.  Amamante cuando sienta la necesidad de reducir la plenitud de sus senos o cuando el beb muestre signos de hambre. Las seales de que el beb puede tener hambre son:  Aumenta su estado de alerta o vigilancia.  Se estira.  Mueve la cabeza de un lado a otro.  Mueve la cabeza y abre la boca cuando se le toca la mejilla o la boca (reflejo de succin).  Aumenta las vocalizaciones, tales como sonidos de succin, relamerse los labios, arrullos, suspiros, o chirridos.  Mueve la mano hacia la boca.  Se chupa con ganas los dedos o las manos.  Agitacin.  Llanto intermitente.  Los signos de hambre extrema requerirn que lo calme y lo consuele antes de tratar de alimentarlo. Los signos de hambre extrema son:  Agitacin.  Llanto fuerte e intenso.  Gritos.  El amamantamiento frecuente la ayudar a producir ms leche y a prevenir problemas de dolor en los pezones e hinchazn de las mamas.  LACTANCIA MATERNA   Ya sea que se encuentre acostada o sentada, asegrese que el abdomen del beb est enfrente el suyo.   Sostenga la mama con el pulgar por arriba y los otros 4 dedos por debajo del pezn. Asegrese que sus dedos se encuentren lejos del pezn y de la boca del beb.   Empuje suavemente los labios del beb  con el pezn o con el dedo.   Cuando la boca del beb se abra lo suficiente, introduzca el pezn y la zona oscura que lo rodea (areola)   tanto como le sea posible dentro de la boca.  Debe haber ms areola visible por arriba del labio superior que por debajo del labio inferior.  La lengua del beb debe estar entre la enca inferior y el seno.  Asegrese de que la boca del beb est en la posicin correcta alrededor del pezn (prendida). Los labios del beb deben crear un sello sobre su pecho.  Las seales de que el beb se ha prendido eficazmente al pezn son:  Tironea o succiona sin dolor.  Se escucha que traga entre las succiones.  No hace ruidos ni chasquidos.  Hay movimientos musculares por arriba y por delante de sus odos al succionar.  El beb debe succionar unos 2-3 minutos para que salga la leche. Permita que el nio se alimente en cada mama todo lo que desee. Alimente al beb hasta que se desprenda o se quede dormido en el primer pecho y luego ofrzcale el segundo pecho.  Las seales de que el beb est lleno y satisfecho son:  Disminuye gradualmente el nmero de succiones o no succiona.  Se queda dormido.  Extiende o relaja su cuerpo.  Retiene una pequea cantidad de leche en la boca.  Se desprende del pecho por s mismo.  Los signos de una lactancia materna eficaz son:  Los senos han aumentado la firmeza, el peso y el tamao antes de la alimentacin.  Son ms blandos despus de amamantar.  Un aumento del volumen de leche, y tambin el cambio de su consistencia y color se producen hacia el quinto da de lactancia materna.  La congestin mamaria se alivia al dar de mamar.  Los pezones no duelen, ni estn agrietados ni sangran.  De ser necesario, interrumpa la succin poniendo su dedo en la esquina de la boca del beb y deslizando el dedo entre sus encas. A continuacin, retire la mama de su boca.  Es comn que los bebs regurgiten un poco despus de  comer.  A menudo los bebs tragan aire al alimentarse. Esto puede hacer que se sienta molesto. Hacer eructar al beb al cambiar de pecho puede ser de ayuda.  Se recomiendan suplementos de vitamina D para los bebs que reciben slo leche materna.  Evite el uso del chupete durante las primeras 4 a 6 semanas de vida.  Evite la alimentacin suplementaria con agua, frmula o jugo en lugar de la leche materna. La leche materna es todo el alimento que el beb necesita. No es necesario que el nio ingiera agua o preparados de bibern. Sus pechos producirn ms leche si se evita la alimentacin suplementaria durante las primeras semanas. COMO SABER SI EL BEB OBTIENE LA SUFICIENTE LECHE MATERNA  Preguntarse si el beb obtiene la cantidad suficiente de leche es una preocupacin frecuente entre las madres. Puede asegurarse que el beb tiene la leche suficiente si:   El beb succiona activamente y usted escucha que traga.   El beb parece estar relajado y satisfecho despus de mamar.   El nio se alimenta al menos 8 a 12 veces en 24 horas.  Durante los primeros 3 a 5 das de vida:  Moja 3-5 paales en 24 horas. La materia fecal debe ser blanda y amarillenta.  Tiene al menos 3 a 4 deposiciones en 24 horas. La materia fecal debe ser blanda y amarillenta.  A los 5-7 das de vida, el beb debe tener al menos 3-6 deposiciones en 24 horas. La materia fecal debe ser grumosa y amarilla a los 5 das de vida.    Su beb tiene una prdida de peso menor a 7al 10% durante los primeros 3 das de vida.  El beb no pierde peso despus de 3-7 das de vida.  El beb debe aumentar 4 a 6 libras (120 a 170 gr.) por semana despus de los 4 das de vida.  Aumenta de peso a los 5 das de vida y vuelve al peso del nacimiento dentro de las 2 semanas. CONGESTIN MAMARIA  Durante la primera semana despus del parto, usted puede experimentar hinchazn en las mamas (congestin mamaria). Al estar congestionadas, las mamas se  sienten pesadas, calientes o sensibles al tacto. El pico de la congestin ocurre a las 24 -48 horas despus del parto.   La congestin puede disminuirse:  Continuando con la lactancia materna.  Aumentando la frecuencia.  Tomando duchas calientes o aplicando calor hmedo en los senos antes de cada comida. Esto aumenta la circulacin y ayuda a que la leche fluya.   Masajeando suavemente el pecho antes y durante las comidas. Con las yemas de los dedos, masajee la pared del pecho hacia el pezn en un movimiento circular.   Asegurarse de que el beb vaca al menos uno de sus pechos en cada alimentacin. Tambin ayuda si comienza la siguiente toma en el otro seno.   Extraiga manualmente o con un sacaleches las mamas para vaciar los pechos si el beb tiene sueo o no se aliment bien. Tambin puede extraer la leche cuando vuelva a trabajar o si siente que se estn congestionando las mamas.  Asegrese de que el beb se prende y est bien colocado durante la lactancia. Si sigue estas indicaciones, la congestin debe mejorar en 24 a 48 horas. Si an tiene dificultades, consulte a su asesor en lactancia.  CUDESE USTED MISMA  Cuide sus mamas.   Bese o dchese diariamente.   Evite usar jabn en los pezones.   Use un sostn de soporte Evite el uso de sostenes con aro.  Seque al aire sus pezones durante 3-4 minutos despus de cada comida.   Utilice slo apsitos de algodn en el sostn para absorber las prdidas de leche. La prdida de un poco de leche materna entre las comidas es normal.   Use solamente lanolina pura en sus pezones despus de amamantar. Usted no tiene que lavarla antes de alimentar al beb. Otra opcin es sacarse unas gotas de leche y masajear suavemente los pezones.  Continuar con los autocontroles de la mama. Cudese.   Consuma alimentos saludables. Alterne 3 comidas con 3 colaciones.  Evite los alimentos que usted nota que perjudican al beb.  Beba leche, jugos  de fruta y agua para satisfacer su sed (aproximadamente 8 vasos al da).   Descanse con frecuencia, reljese y tome sus vitaminas prenatales para evitar la fatiga, el estrs y la anemia.  Evite masticar y fumar tabaco.  Evite el consumo de alcohol y drogas.  Tome medicamentos de venta libre y recetados tal como le indic su mdico o farmacutico. Siempre debe consultar con su mdico o farmacutico antes de tomar cualquier medicamento, vitamina o suplemento de hierbas.  Sepa que durante la lactancia puede quedar embarazada. Si lo desea, hable con su mdico acerca de la planificacin familiar y los mtodos anticonceptivos seguros que puede utilizar durante la lactancia. SOLICITE ATENCIN MDICA SI:   Usted siente que quiere dejar de amamantar o se siente frustrada con la lactancia.  Siente dolor en los senos o en los pezones.  Sus pezones estn agrietados o sangran.  Sus   pechos estn irritados, sensibles o calientes.  Tiene un rea hinchada en cualquiera de los senos.  Siente escalofros o fiebre.  Tiene nuseas o vmitos.  Observa un drenaje en los pezones.  Sus mamas no se llenan antes de amamantarlo al 5to da despus del parto.  Se siente triste y deprimida.  El nio est demasiado somnoliento como para comer.  El nio tiene problemas para respirar.   Moja menos de 3 paales en 24 horas.  Mueve el intestino menos de 3 veces en 24 horas.  La piel del beb o la parte blanca de sus ojos est ms amarilla.   El beb no ha aumentado de peso a los 5 das de vida. ASEGRESE DE QUE:   Comprende estas instrucciones.  Controlar su enfermedad.  Solicitar ayuda de inmediato si no mejora o si empeora. Document Released: 01/28/2005 Document Revised: 10/23/2011 [employer] Patient Information 2014 [employer].  

## 2012-08-18 ENCOUNTER — Ambulatory Visit (HOSPITAL_COMMUNITY)
Admission: RE | Admit: 2012-08-18 | Discharge: 2012-08-18 | Disposition: A | Discharge status: Home / Self Care | Payer: Self-pay | Source: Ambulatory Visit | Attending: Family Medicine | Admitting: Family Medicine

## 2012-08-18 DIAGNOSIS — O99810 Abnormal glucose complicating pregnancy: Secondary | ICD-10-CM | POA: Insufficient documentation

## 2012-08-18 DIAGNOSIS — O24419 Gestational diabetes mellitus in pregnancy, unspecified control: Secondary | ICD-10-CM

## 2012-08-18 DIAGNOSIS — Z3689 Encounter for other specified antenatal screening: Secondary | ICD-10-CM | POA: Insufficient documentation

## 2012-08-20 ENCOUNTER — Other Ambulatory Visit: Payer: Self-pay

## 2012-08-24 ENCOUNTER — Ambulatory Visit (INDEPENDENT_AMBULATORY_CARE_PROVIDER_SITE_OTHER): Payer: Self-pay | Admitting: Family Medicine

## 2012-08-24 VITALS — BP 109/59 | Wt 158.3 lb

## 2012-08-24 DIAGNOSIS — O99810 Abnormal glucose complicating pregnancy: Secondary | ICD-10-CM

## 2012-08-24 DIAGNOSIS — O24419 Gestational diabetes mellitus in pregnancy, unspecified control: Secondary | ICD-10-CM

## 2012-08-24 LAB — FETAL NONSTRESS TEST

## 2012-08-24 LAB — POCT URINALYSIS DIP (DEVICE)
Bilirubin Urine: NEGATIVE
Glucose, UA: NEGATIVE mg/dL
Ketones, ur: NEGATIVE mg/dL
pH: 6 (ref 5.0–8.0)

## 2012-08-24 MED ORDER — PRENATAL MULTIVITAMIN CH: 1.0000 | ORAL_TABLET | Freq: Every day | ORAL | Status: DC

## 2012-08-24 MED ORDER — GLYBURIDE 2.5 MG PO TABS: 2.5000 mg | ORAL_TABLET | Freq: Every day | ORAL | Status: DC

## 2012-08-24 NOTE — Progress Notes (Signed)
NST reviewed and reactive. FBS 79-122 2 hr pp 77-206-1/2 are out of range, but better over last 4 days.--add 2.5 mg glyburide at [facility] today

## 2012-08-24 NOTE — Patient Instructions (Signed)
Lactancia materna  (Breastfeeding)  El cambio hormonal durante el embarazo produce el desarrollo del tejido mamario y un aumento en el nmero y tamao de los conductos galactforos. La hormona prolactina permite que las protenas, los azcares y las grasas de la sangre produzcan la leche materna en las glndulas productoras de leche. La hormona progesterona impide que la leche materna sea liberada antes del nacimiento del beb. Despus del nacimiento del beb, su nivel de progesterona disminuye permitiendo que la leche materna sea liberada. Pensar en el beb, as como la succin o el llanto, pueden estimular la liberacin de leche de las glndulas productoras de leche.  La decisin de amamantar (lactar) es una de las mejores opciones que usted puede hacer para usted y su beb. La informacin que sigue da una breve resea de los beneficios, as como otras caractersticas importantes que debe saber sobre la lactancia materna.  LOS BENEFICIOS DE AMAMANTAR  Para el beb   La primera leche (calostro) ayuda al mejor funcionamiento del sistema digestivo del beb.   La leche tiene anticuerpos que provienen de la madre y que ayudan a prevenir las infecciones en el beb.   El beb tiene una menor incidencia de asma, alergias y del sndrome de muerte sbita del lactante (SMSL).   Los nutrientes de la leche materna son mejores para el beb que la leche maternizada.  La leche materna mejora el desarrollo cerebral del beb.   Su beb tendr menos gases, clicos y estreimiento.  Es menos probable que el beb desarrolle otras enfermedades, como obesidad infantil, asma o diabetes mellitus. Para usted   La lactancia materna favorece el desarrollo de un vnculo muy especial entre la madre y el beb.   Es ms conveniente, siempre disponible, a la temperatura adecuada y econmica.   La lactancia materna ayuda a quemar caloras y a perder el peso ganado durante el embarazo.   Hace que el tero se  contraiga ms rpidamente a su tamao normal y disminuye el sangrado despus del parto.   Las madres que amamantan tienen menos riesgo de desarrollar osteoporosis o cncer de mama o de ovario en el futuro.  FRECUENCIA DEL AMAMANTAMIENTO   Un beb sano, nacido a trmino, puede amamantarse con tanta frecuencia como cada hora, o espaciar las comidas cada tres horas. La frecuencia en la lactancia varan de un beb a otro.   Los recin nacidos deben ser alimentados por lo menos cada 2-3 horas durante el da y cada 4-5 horas durante la noche. Usted debe amamantarlo un mnimo de 8 tomas en un perodo de 24 horas.  Despierte al beb para amamantarlo si han pasado 3-4 horas desde la ltima comida.  Amamante cuando sienta la necesidad de reducir la plenitud de sus senos o cuando el beb muestre signos de hambre. Las seales de que el beb puede tener hambre son:  Aumenta su estado de alerta o vigilancia.  Se estira.  Mueve la cabeza de un lado a otro.  Mueve la cabeza y abre la boca cuando se le toca la mejilla o la boca (reflejo de succin).  Aumenta las vocalizaciones, tales como sonidos de succin, relamerse los labios, arrullos, suspiros, o chirridos.  Mueve la mano hacia la boca.  Se chupa con ganas los dedos o las manos.  Agitacin.  Llanto intermitente.  Los signos de hambre extrema requerirn que lo calme y lo consuele antes de tratar de alimentarlo. Los signos de hambre extrema son:  Agitacin.  Llanto fuerte e intenso.    Gritos.  El amamantamiento frecuente la ayudar a producir ms leche y a prevenir problemas de dolor en los pezones e hinchazn de las mamas.  LACTANCIA MATERNA   Ya sea que se encuentre acostada o sentada, asegrese que el abdomen del beb est enfrente el suyo.   Sostenga la mama con el pulgar por arriba y los otros 4 dedos por debajo del pezn. Asegrese que sus dedos se encuentren lejos del pezn y de la boca del beb.   Empuje suavemente los  labios del beb con el pezn o con el dedo.   Cuando la boca del beb se abra lo suficiente, introduzca el pezn y la zona oscura que lo rodea (areola) tanto como le sea posible dentro de la boca.  Debe haber ms areola visible por arriba del labio superior que por debajo del labio inferior.  La lengua del beb debe estar entre la enca inferior y el seno.  Asegrese de que la boca del beb est en la posicin correcta alrededor del pezn (prendida). Los labios del beb deben crear un sello sobre su pecho.  Las seales de que el beb se ha prendido eficazmente al pezn son:  Tironea o succiona sin dolor.  Se escucha que traga entre las succiones.  No hace ruidos ni chasquidos.  Hay movimientos musculares por arriba y por delante de sus odos al succionar.  El beb debe succionar unos 2-3 minutos para que salga la leche. Permita que el nio se alimente en cada mama todo lo que desee. Alimente al beb hasta que se desprenda o se quede dormido en el primer pecho y luego ofrzcale el segundo pecho.  Las seales de que el beb est lleno y satisfecho son:  Disminuye gradualmente el nmero de succiones o no succiona.  Se queda dormido.  Extiende o relaja su cuerpo.  Retiene una pequea cantidad de leche en la boca.  Se desprende del pecho por s mismo.  Los signos de una lactancia materna eficaz son:  Los senos han aumentado la firmeza, el peso y el tamao antes de la alimentacin.  Son ms blandos despus de amamantar.  Un aumento del volumen de leche, y tambin el cambio de su consistencia y color se producen hacia el quinto da de lactancia materna.  La congestin mamaria se alivia al dar de mamar.  Los pezones no duelen, ni estn agrietados ni sangran.  De ser necesario, interrumpa la succin poniendo su dedo en la esquina de la boca del beb y deslizando el dedo entre sus encas. A continuacin, retire la mama de su boca.  Es comn que los bebs regurgiten un poco  despus de comer.  A menudo los bebs tragan aire al alimentarse. Esto puede hacer que se sienta molesto. Hacer eructar al beb al cambiar de pecho puede ser de ayuda.  Se recomiendan suplementos de vitamina D para los bebs que reciben slo leche materna.  Evite el uso del chupete durante las primeras 4 a 6 semanas de vida.  Evite la alimentacin suplementaria con agua, frmula o jugo en lugar de la leche materna. La leche materna es todo el alimento que el beb necesita. No es necesario que el nio ingiera agua o preparados de bibern. Sus pechos producirn ms leche si se evita la alimentacin suplementaria durante las primeras semanas. COMO SABER SI EL BEB OBTIENE LA SUFICIENTE LECHE MATERNA  Preguntarse si el beb obtiene la cantidad suficiente de leche es una preocupacin frecuente entre las madres. Puede asegurarse que el   beb tiene la leche suficiente si:   El beb succiona activamente y usted escucha que traga.   El beb parece estar relajado y satisfecho despus de mamar.   El nio se alimenta al menos 8 a 12 veces en 24 horas.  Durante los primeros 3 a 5 das de vida:  Moja 3-5 paales en 24 horas. La materia fecal debe ser blanda y amarillenta.  Tiene al menos 3 a 4 deposiciones en 24 horas. La materia fecal debe ser blanda y amarillenta.  A los 5-7 das de vida, el beb debe tener al menos 3-6 deposiciones en 24 horas. La materia fecal debe ser grumosa y amarilla a los 5 das de vida.  Su beb tiene una prdida de peso menor a 7al 10% durante los primeros 3 das de vida.  El beb no pierde peso despus de 3-7 das de vida.  El beb debe aumentar 4 a 6 libras (120 a 170 gr.) por semana despus de los 4 das de vida.  Aumenta de peso a los 5 das de vida y vuelve al peso del nacimiento dentro de las 2 semanas. CONGESTIN MAMARIA  Durante la primera semana despus del parto, usted puede experimentar hinchazn en las mamas (congestin mamaria). Al estar congestionadas,  las mamas se sienten pesadas, calientes o sensibles al tacto. El pico de la congestin ocurre a las 24 -48 horas despus del parto.   La congestin puede disminuirse:  Continuando con la lactancia materna.  Aumentando la frecuencia.  Tomando duchas calientes o aplicando calor hmedo en los senos antes de cada comida. Esto aumenta la circulacin y ayuda a que la leche fluya.   Masajeando suavemente el pecho antes y durante las comidas. Con las yemas de los dedos, masajee la pared del pecho hacia el pezn en un movimiento circular.   Asegurarse de que el beb vaca al menos uno de sus pechos en cada alimentacin. Tambin ayuda si comienza la siguiente toma en el otro seno.   Extraiga manualmente o con un sacaleches las mamas para vaciar los pechos si el beb tiene sueo o no se aliment bien. Tambin puede extraer la leche cuando vuelva a trabajar o si siente que se estn congestionando las mamas.  Asegrese de que el beb se prende y est bien colocado durante la lactancia. Si sigue estas indicaciones, la congestin debe mejorar en 24 a 48 horas. Si an tiene dificultades, consulte a su asesor en lactancia.  CUDESE USTED MISMA  Cuide sus mamas.   Bese o dchese diariamente.   Evite usar jabn en los pezones.   Use un sostn de soporte Evite el uso de sostenes con aro.  Seque al aire sus pezones durante 3-4 minutos despus de cada comida.   Utilice slo apsitos de algodn en el sostn para absorber las prdidas de leche. La prdida de un poco de leche materna entre las comidas es normal.   Use solamente lanolina pura en sus pezones despus de amamantar. Usted no tiene que lavarla antes de alimentar al beb. Otra opcin es sacarse unas gotas de leche y masajear suavemente los pezones.  Continuar con los autocontroles de la mama. Cudese.   Consuma alimentos saludables. Alterne 3 comidas con 3 colaciones.  Evite los alimentos que usted nota que perjudican al beb.  Beba  leche, jugos de fruta y agua para satisfacer su sed (aproximadamente 8 vasos al da).   Descanse con frecuencia, reljese y tome sus vitaminas prenatales para evitar la fatiga, el estrs y   la anemia.  Evite masticar y fumar tabaco.  Evite el consumo de alcohol y drogas.  Tome medicamentos de venta libre y recetados tal como le indic su mdico o farmacutico. Siempre debe consultar con su mdico o farmacutico antes de tomar cualquier medicamento, vitamina o suplemento de hierbas.  Sepa que durante la lactancia puede quedar embarazada. Si lo desea, hable con su mdico acerca de la planificacin familiar y los mtodos anticonceptivos seguros que puede utilizar durante la lactancia. SOLICITE ATENCIN MDICA SI:   Usted siente que quiere dejar de amamantar o se siente frustrada con la lactancia.  Siente dolor en los senos o en los pezones.  Sus pezones estn agrietados o sangran.  Sus pechos estn irritados, sensibles o calientes.  Tiene un rea hinchada en cualquiera de los senos.  Siente escalofros o fiebre.  Tiene nuseas o vmitos.  Observa un drenaje en los pezones.  Sus mamas no se llenan antes de amamantarlo al 5to da despus del parto.  Se siente triste y deprimida.  El nio est demasiado somnoliento como para comer.  El nio tiene problemas para respirar.   Moja menos de 3 paales en 24 horas.  Mueve el intestino menos de 3 veces en 24 horas.  La piel del beb o la parte blanca de sus ojos est ms amarilla.   El beb no ha aumentado de peso a los 5 das de vida. ASEGRESE DE QUE:   Comprende estas instrucciones.  Controlar su enfermedad.  Solicitar ayuda de inmediato si no mejora o si empeora. Document Released: 01/28/2005 Document Revised: 10/23/2011 [employer] Patient Information 2014 [employer].  

## 2012-08-24 NOTE — Progress Notes (Signed)
P-73 

## 2012-08-25 LAB — GC/CHLAMYDIA PROBE AMP
CT Probe RNA: NEGATIVE
GC Probe RNA: NEGATIVE

## 2012-08-27 ENCOUNTER — Ambulatory Visit (INDEPENDENT_AMBULATORY_CARE_PROVIDER_SITE_OTHER): Payer: Self-pay | Admitting: *Deleted

## 2012-08-27 VITALS — BP 116/73

## 2012-08-27 DIAGNOSIS — O99810 Abnormal glucose complicating pregnancy: Secondary | ICD-10-CM

## 2012-08-27 DIAGNOSIS — O24419 Gestational diabetes mellitus in pregnancy, unspecified control: Secondary | ICD-10-CM

## 2012-08-27 NOTE — Progress Notes (Signed)
P - 74 

## 2012-08-28 ENCOUNTER — Encounter: Payer: Self-pay | Admitting: Family Medicine

## 2012-08-28 DIAGNOSIS — O99820 Streptococcus B carrier state complicating pregnancy: Secondary | ICD-10-CM | POA: Insufficient documentation

## 2012-08-31 ENCOUNTER — Ambulatory Visit (INDEPENDENT_AMBULATORY_CARE_PROVIDER_SITE_OTHER): Payer: Self-pay | Admitting: Obstetrics and Gynecology

## 2012-08-31 VITALS — BP 114/76 | Temp 97.2°F | Wt 158.9 lb

## 2012-08-31 DIAGNOSIS — O99810 Abnormal glucose complicating pregnancy: Secondary | ICD-10-CM

## 2012-08-31 DIAGNOSIS — O24419 Gestational diabetes mellitus in pregnancy, unspecified control: Secondary | ICD-10-CM

## 2012-08-31 LAB — POCT URINALYSIS DIP (DEVICE)
Glucose, UA: 250 mg/dL — AB
Hgb urine dipstick: NEGATIVE
Nitrite: NEGATIVE
Protein, ur: NEGATIVE mg/dL
Specific Gravity, Urine: 1.01 (ref 1.005–1.030)
Urobilinogen, UA: 0.2 mg/dL (ref 0.0–1.0)
pH: 5.5 (ref 5.0–8.0)

## 2012-08-31 NOTE — Progress Notes (Signed)
nst reactive. cultures neg. Since starting glyburide, cbgs marginallly improved. Discussed diet and c/w ann clark-will increase glyburide to 2.5 bid and consider insulin next if not improved. 2x/wk fats and growth scan next wk. Uncomplicated svd with prior lga 9-9. FM awareness reviewed., s/sx labor.

## 2012-08-31 NOTE — Progress Notes (Signed)
Pulse- 73 Patient reports lower abdominal/pelvic pressure

## 2012-08-31 NOTE — Patient Instructions (Signed)
Evaluacin de los movimientos fetales  (Fetal Movement Counts) Nombre del paciente: __________________________________________________ Fecha de parto estimada: ____________________ La evaluacin de los movimientos fetales es muy recomendable en los embarazos de alto riesgo, pero tambin es una buena idea que lo hagan todas las embarazadas. El mdico le indicar que comience a contarlos a las 28 semanas de embarazo. Los movimientos fetales suelen aumentar:   Despus de una comida completa.  Despus de la actividad fsica.  Despus de comer o beber algo dulce o fro.  En reposo. Preste atencin cuando sienta que el beb est ms activo. Esto le ayudar a notar un patrn de ciclos de vigilia y sueo de su beb y cules son los factores que contribuyen a un aumento de los movimientos fetales. Es importante llevar a cabo un recuento de movimientos fetales, al mismo tiempo cada da, cuando el beb normalmente est ms activo.  CMO CONTAR LOS MOVIMIENTOS FETALES 1. Busque un lugar tranquilo y cmodo para sentarse o recostarse sobre el lado izquierdo. Al recostarse sobre su lado izquierdo, le proporciona una mejor circulacin de sangre y oxgeno al beb. 2. Anote el da y la hora en una hoja de papel o en un diario. 3. Comience contando las pataditas, revoloteos, chasquidos, vueltas o pinchazos en un perodo de 2 horas. Debe sentir al menos 10 movimientos en 2 horas. 4. Si no siente 10 movimientos en 2 horas, espere 2  3 horas y cuente de nuevo. Busque cambios en el patrn o si no cuenta lo suficiente en 2 horas. SOLICITE ATENCIN MDICA SI:   Siente menos de 10 pataditas en 2 horas, en dos intentos.  No hay movimientos durante una hora.  El patrn se modifica o le lleva ms tiempo cada da contar las 10 pataditas.  Siente que el beb no se mueve como lo hace habitualmente. Fecha: ____________ Movimientos: ____________ Hora de inicio: ____________ Hora de finalizacin: ____________  Fecha:  ____________ Movimientos: ____________ Hora de inicio: ____________ Hora de finalizacin: ____________  Fecha: ____________ Movimientos: ____________ Hora de inicio: ____________ Hora de finalizacin: ____________  Fecha: ____________ Movimientos: ____________ Hora de inicio: ____________ Hora de finalizacin: ____________  Fecha: ____________ Movimientos: ____________ Hora de inicio: ____________ Hora de finalizacin: ____________  Fecha: ____________ Movimientos: ____________ Hora de inicio: ____________ Hora de finalizacin: ____________  Fecha: ____________ Movimientos: ____________ Hora de inicio: ____________ Hora de finalizacin: ____________  Fecha: ____________ Movimientos: ____________ Hora de inicio: ____________ Hora de finalizacin: ____________  Fecha: ____________ Movimientos: ____________ Hora de inicio: ____________ Hora de finalizacin: ____________  Fecha: ____________ Movimientos: ____________ Hora de inicio: ____________ Hora de finalizacin: ____________  Fecha: ____________ Movimientos: ____________ Hora de inicio: ____________ Hora de finalizacin: ____________  Fecha: ____________ Movimientos: ____________ Hora de inicio: ____________ Hora de finalizacin: ____________  Fecha: ____________ Movimientos: ____________ Hora de inicio: ____________ Hora de finalizacin: ____________  Fecha: ____________ Movimientos: ____________ Hora de inicio: ____________ Hora de finalizacin: ____________  Fecha: ____________ Movimientos: ____________ Hora de inicio: ____________ Hora de finalizacin: ____________  Fecha: ____________ Movimientos: ____________ Hora de inicio: ____________ Hora de finalizacin: ____________  Fecha: ____________ Movimientos: ____________ Hora de inicio: ____________ Hora de finalizacin: ____________  Fecha: ____________ Movimientos: ____________ Hora de inicio: ____________ Hora de finalizacin: ____________  Fecha: ____________ Movimientos: ____________ Hora  de inicio: ____________ Hora de finalizacin: ____________  Fecha: ____________ Movimientos: ____________ Hora de inicio: ____________ Hora de finalizacin: ____________  Fecha: ____________ Movimientos: ____________ Hora de inicio: ____________ Hora de finalizacin: ____________  Fecha: ____________ Movimientos: ____________ Hora de inicio: ____________ Hora de   finalizacin: ____________  Fecha: ____________ Movimientos: ____________ Hora de inicio: ____________ Hora de finalizacin: ____________  Fecha: ____________ Movimientos: ____________ Hora de inicio: ____________ Hora de finalizacin: ____________  Fecha: ____________ Movimientos: ____________ Hora de inicio: ____________ Hora de finalizacin: ____________  Fecha: ____________ Movimientos: ____________ Hora de inicio: ____________ Hora de finalizacin: ____________  Fecha: ____________ Movimientos: ____________ Hora de inicio: ____________ Hora de finalizacin: ____________  Fecha: ____________ Movimientos: ____________ Hora de inicio: ____________ Hora de finalizacin: ____________  Fecha: ____________ Movimientos: ____________ Hora de inicio: ____________ Hora de finalizacin: ____________  Fecha: ____________ Movimientos: ____________ Hora de inicio: ____________ Hora de finalizacin: ____________  Fecha: ____________ Movimientos: ____________ Hora de inicio: ____________ Hora de finalizacin: ____________  Fecha: ____________ Movimientos: ____________ Hora de inicio: ____________ Hora de finalizacin: ____________  Fecha: ____________ Movimientos: ____________ Hora de inicio: ____________ Hora de finalizacin: ____________  Fecha: ____________ Movimientos: ____________ Hora de inicio: ____________ Hora de finalizacin: ____________  Fecha: ____________ Movimientos: ____________ Hora de inicio: ____________ Hora de finalizacin: ____________  Fecha: ____________ Movimientos: ____________ Hora de inicio: ____________ Hora de finalizacin:  ____________  Fecha: ____________ Movimientos: ____________ Hora de inicio: ____________ Hora de finalizacin: ____________  Fecha: ____________ Movimientos: ____________ Hora de inicio: ____________ Hora de finalizacin: ____________  Fecha: ____________ Movimientos: ____________ Hora de inicio: ____________ Hora de finalizacin: ____________  Fecha: ____________ Movimientos: ____________ Hora de inicio: ____________ Hora de finalizacin: ____________  Fecha: ____________ Movimientos: ____________ Hora de inicio: ____________ Hora de finalizacin: ____________  Fecha: ____________ Movimientos: ____________ Hora de inicio: ____________ Hora de finalizacin: ____________  Fecha: ____________ Movimientos: ____________ Hora de inicio: ____________ Hora de finalizacin: ____________  Fecha: ____________ Movimientos: ____________ Hora de inicio: ____________ Hora de finalizacin: ____________  Fecha: ____________ Movimientos: ____________ Hora de inicio: ____________ Hora de finalizacin: ____________  Fecha: ____________ Movimientos: ____________ Hora de inicio: ____________ Hora de finalizacin: ____________  Fecha: ____________ Movimientos: ____________ Hora de inicio: ____________ Hora de finalizacin: ____________  Fecha: ____________ Movimientos: ____________ Hora de inicio: ____________ Hora de finalizacin: ____________  Fecha: ____________ Movimientos: ____________ Hora de inicio: ____________ Hora de finalizacin: ____________  Fecha: ____________ Movimientos: ____________ Hora de inicio: ____________ Hora de finalizacin: ____________  Fecha: ____________ Movimientos: ____________ Hora de inicio: ____________ Hora de finalizacin: ____________  Fecha: ____________ Movimientos: ____________ Hora de inicio: ____________ Hora de finalizacin: ____________  Fecha: ____________ Movimientos: ____________ Hora de inicio: ____________ Hora de finalizacin: ____________  Fecha: ____________  Movimientos: ____________ Hora de inicio: ____________ Hora de finalizacin: ____________  Fecha: ____________ Movimientos: ____________ Hora de inicio: ____________ Hora de finalizacin: ____________  Fecha: ____________ Movimientos: ____________ Hora de inicio: ____________ Hora de finalizacin: ____________  Document Released: 05/07/2007 Document Revised: 01/15/2012 [employer] Patient Information 2014 [employer].  

## 2012-09-01 NOTE — Progress Notes (Signed)
Reactive NST on 08/27/12 

## 2012-09-03 ENCOUNTER — Ambulatory Visit (INDEPENDENT_AMBULATORY_CARE_PROVIDER_SITE_OTHER): Payer: Self-pay | Admitting: *Deleted

## 2012-09-03 VITALS — BP 111/64

## 2012-09-03 DIAGNOSIS — O99810 Abnormal glucose complicating pregnancy: Secondary | ICD-10-CM

## 2012-09-03 DIAGNOSIS — O24419 Gestational diabetes mellitus in pregnancy, unspecified control: Secondary | ICD-10-CM

## 2012-09-03 NOTE — Progress Notes (Signed)
P - 81 

## 2012-09-03 NOTE — Progress Notes (Signed)
NST performed today was reviewed and was found to be reactive.  AFI normal at 15.4 cm.  Continue recommended antenatal testing and prenatal care. 

## 2012-09-07 ENCOUNTER — Encounter (HOSPITAL_COMMUNITY): Payer: Self-pay | Admitting: *Deleted

## 2012-09-07 ENCOUNTER — Other Ambulatory Visit: Payer: Self-pay

## 2012-09-07 ENCOUNTER — Ambulatory Visit (INDEPENDENT_AMBULATORY_CARE_PROVIDER_SITE_OTHER): Payer: Self-pay | Admitting: Obstetrics and Gynecology

## 2012-09-07 ENCOUNTER — Telehealth (HOSPITAL_COMMUNITY): Payer: Self-pay | Admitting: *Deleted

## 2012-09-07 ENCOUNTER — Encounter: Payer: Self-pay | Admitting: Obstetrics and Gynecology

## 2012-09-07 VITALS — BP 112/72 | Wt 158.2 lb

## 2012-09-07 DIAGNOSIS — O09899 Supervision of other high risk pregnancies, unspecified trimester: Secondary | ICD-10-CM

## 2012-09-07 DIAGNOSIS — O99810 Abnormal glucose complicating pregnancy: Secondary | ICD-10-CM

## 2012-09-07 DIAGNOSIS — O99820 Streptococcus B carrier state complicating pregnancy: Secondary | ICD-10-CM

## 2012-09-07 DIAGNOSIS — O99212 Obesity complicating pregnancy, second trimester: Secondary | ICD-10-CM

## 2012-09-07 DIAGNOSIS — O99210 Obesity complicating pregnancy, unspecified trimester: Secondary | ICD-10-CM

## 2012-09-07 DIAGNOSIS — O24419 Gestational diabetes mellitus in pregnancy, unspecified control: Secondary | ICD-10-CM

## 2012-09-07 DIAGNOSIS — Z22330 Carrier of Group B streptococcus: Secondary | ICD-10-CM

## 2012-09-07 DIAGNOSIS — O09299 Supervision of pregnancy with other poor reproductive or obstetric history, unspecified trimester: Secondary | ICD-10-CM

## 2012-09-07 LAB — POCT URINALYSIS DIP (DEVICE)
Hgb urine dipstick: NEGATIVE
Nitrite: NEGATIVE
Protein, ur: NEGATIVE mg/dL
Urobilinogen, UA: 0.2 mg/dL (ref 0.0–1.0)
pH: 6 (ref 5.0–8.0)

## 2012-09-07 NOTE — Progress Notes (Signed)
NST reviewed and reactive. Patient doing well without complaints. CBG f [id number] and 2hr pp [phone] Patient admits to not always adhering to diet and is not consuming protein rich snacks. Continue glyburide and follow diabetic diet during this last week of pregnancy. Patient scheduled for induction on 8/3

## 2012-09-07 NOTE — Telephone Encounter (Signed)
Preadmission screen  

## 2012-09-07 NOTE — Progress Notes (Signed)
P = 72    [place] for growth scheduled on 7/31.   IOL on 8/3 @ 0700

## 2012-09-08 ENCOUNTER — Telehealth (HOSPITAL_COMMUNITY): Payer: Self-pay | Admitting: *Deleted

## 2012-09-08 ENCOUNTER — Encounter (HOSPITAL_COMMUNITY): Payer: Self-pay | Admitting: *Deleted

## 2012-09-08 NOTE — Telephone Encounter (Signed)
[id number] interpreter number Preadmission screen

## 2012-09-10 ENCOUNTER — Ambulatory Visit (HOSPITAL_COMMUNITY): Payer: Self-pay

## 2012-09-10 ENCOUNTER — Ambulatory Visit (INDEPENDENT_AMBULATORY_CARE_PROVIDER_SITE_OTHER): Payer: Self-pay | Admitting: *Deleted

## 2012-09-10 ENCOUNTER — Ambulatory Visit (HOSPITAL_COMMUNITY)
Admission: RE | Admit: 2012-09-10 | Discharge: 2012-09-10 | Disposition: A | Discharge status: Home / Self Care | Payer: Self-pay | Source: Ambulatory Visit | Attending: Obstetrics and Gynecology | Admitting: Obstetrics and Gynecology

## 2012-09-10 VITALS — BP 121/73

## 2012-09-10 DIAGNOSIS — O99810 Abnormal glucose complicating pregnancy: Secondary | ICD-10-CM

## 2012-09-10 DIAGNOSIS — Z3689 Encounter for other specified antenatal screening: Secondary | ICD-10-CM | POA: Insufficient documentation

## 2012-09-10 NOTE — Progress Notes (Signed)
NST performed today was reviewed and was found to be reactive.  Continue recommended antenatal testing and prenatal care.  

## 2012-09-10 NOTE — Progress Notes (Signed)
P = 67   IOL on 8/2 @ 0700.  Instructions given- interpreter present.

## 2012-09-13 ENCOUNTER — Encounter (HOSPITAL_COMMUNITY): Payer: Self-pay

## 2012-09-13 ENCOUNTER — Inpatient Hospital Stay (HOSPITAL_COMMUNITY): Payer: Medicaid Other | Admitting: Anesthesiology

## 2012-09-13 ENCOUNTER — Encounter (HOSPITAL_COMMUNITY): Payer: Self-pay | Admitting: Anesthesiology

## 2012-09-13 ENCOUNTER — Inpatient Hospital Stay (HOSPITAL_COMMUNITY)
Admission: RE | Admit: 2012-09-13 | Discharge: 2012-09-14 | DRG: 775 | Disposition: A | Discharge status: Home / Self Care | Payer: Medicaid Other | Source: Ambulatory Visit | Attending: Family Medicine | Admitting: Family Medicine

## 2012-09-13 DIAGNOSIS — O99814 Abnormal glucose complicating childbirth: Secondary | ICD-10-CM

## 2012-09-13 DIAGNOSIS — O24419 Gestational diabetes mellitus in pregnancy, unspecified control: Secondary | ICD-10-CM

## 2012-09-13 DIAGNOSIS — O99212 Obesity complicating pregnancy, second trimester: Secondary | ICD-10-CM

## 2012-09-13 DIAGNOSIS — Z22330 Carrier of Group B streptococcus: Secondary | ICD-10-CM

## 2012-09-13 DIAGNOSIS — O99892 Other specified diseases and conditions complicating childbirth: Secondary | ICD-10-CM | POA: Diagnosis present

## 2012-09-13 DIAGNOSIS — O9989 Other specified diseases and conditions complicating pregnancy, childbirth and the puerperium: Secondary | ICD-10-CM

## 2012-09-13 DIAGNOSIS — O99820 Streptococcus B carrier state complicating pregnancy: Secondary | ICD-10-CM

## 2012-09-13 LAB — TYPE AND SCREEN
ABO/RH(D): O POS
Antibody Screen: NEGATIVE

## 2012-09-13 LAB — CBC
HCT: 36.8 % (ref 36.0–46.0)
MCHC: 33.7 g/dL (ref 30.0–36.0)
MCV: 91.5 fL (ref 78.0–100.0)
Platelets: 222 10*3/uL (ref 150–400)
RDW: 13.1 % (ref 11.5–15.5)
WBC: 8.9 10*3/uL (ref 4.0–10.5)

## 2012-09-13 MED ORDER — OXYCODONE-ACETAMINOPHEN 5-325 MG PO TABS
1.0000 | ORAL_TABLET | ORAL | Status: DC | PRN
  Administered 2012-09-13 – 2012-09-14 (×2): 1 via ORAL
  Filled 2012-09-13 (×2): qty 1

## 2012-09-13 MED ORDER — CITRIC ACID-SODIUM CITRATE 334-500 MG/5ML PO SOLN: 30.0000 mL | ORAL | Status: DC | PRN

## 2012-09-13 MED ORDER — IBUPROFEN 600 MG PO TABS
600.0000 mg | ORAL_TABLET | Freq: Four times a day (QID) | ORAL | Status: DC | PRN
  Administered 2012-09-13: 600 mg via ORAL
  Filled 2012-09-13: qty 1

## 2012-09-13 MED ORDER — DEXTROSE 5 % IV SOLN
2.5000 10*6.[IU] | INTRAVENOUS | Status: DC
  Filled 2012-09-13 (×5): qty 2.5

## 2012-09-13 MED ORDER — PHENYLEPHRINE 40 MCG/ML (10ML) SYRINGE FOR IV PUSH (FOR BLOOD PRESSURE SUPPORT)
80.0000 ug | PREFILLED_SYRINGE | INTRAVENOUS | Status: DC | PRN
  Filled 2012-09-13: qty 2

## 2012-09-13 MED ORDER — TERBUTALINE SULFATE 1 MG/ML IJ SOLN: 0.2500 mg | Freq: Once | INTRAMUSCULAR | Status: DC | PRN

## 2012-09-13 MED ORDER — OXYTOCIN 40 UNITS IN LACTATED RINGERS INFUSION - SIMPLE MED
1.0000 m[IU]/min | INTRAVENOUS | Status: DC
Start: 2012-09-13 — End: 2012-09-13
  Administered 2012-09-13: 2 m[IU]/min via INTRAVENOUS
  Filled 2012-09-13: qty 1000

## 2012-09-13 MED ORDER — METFORMIN HCL 500 MG PO TABS
500.0000 mg | ORAL_TABLET | Freq: Two times a day (BID) | ORAL | Status: DC
  Filled 2012-09-13 (×2): qty 1

## 2012-09-13 MED ORDER — ONDANSETRON HCL 4 MG/2ML IJ SOLN: 4.0000 mg | Freq: Four times a day (QID) | INTRAMUSCULAR | Status: DC | PRN

## 2012-09-13 MED ORDER — LACTATED RINGERS IV SOLN: 500.0000 mL | INTRAVENOUS | Status: DC | PRN

## 2012-09-13 MED ORDER — OXYCODONE-ACETAMINOPHEN 5-325 MG PO TABS: 1.0000 | ORAL_TABLET | ORAL | Status: DC | PRN

## 2012-09-13 MED ORDER — ZOLPIDEM TARTRATE 5 MG PO TABS: 5.0000 mg | ORAL_TABLET | Freq: Every evening | ORAL | Status: DC | PRN

## 2012-09-13 MED ORDER — SIMETHICONE 80 MG PO CHEW: 80.0000 mg | CHEWABLE_TABLET | ORAL | Status: DC | PRN

## 2012-09-13 MED ORDER — LIDOCAINE HCL (PF) 1 % IJ SOLN
30.0000 mL | INTRAMUSCULAR | Status: DC | PRN
  Filled 2012-09-13 (×2): qty 30

## 2012-09-13 MED ORDER — PHENYLEPHRINE 40 MCG/ML (10ML) SYRINGE FOR IV PUSH (FOR BLOOD PRESSURE SUPPORT)
80.0000 ug | PREFILLED_SYRINGE | INTRAVENOUS | Status: DC | PRN
  Filled 2012-09-13: qty 2
  Filled 2012-09-13: qty 5

## 2012-09-13 MED ORDER — WITCH HAZEL-GLYCERIN EX PADS: 1.0000 "application " | MEDICATED_PAD | CUTANEOUS | Status: DC | PRN

## 2012-09-13 MED ORDER — PRENATAL MULTIVITAMIN CH
1.0000 | ORAL_TABLET | Freq: Every day | ORAL | Status: DC
  Administered 2012-09-14: 1 via ORAL
  Filled 2012-09-13: qty 1

## 2012-09-13 MED ORDER — OXYTOCIN 40 UNITS IN LACTATED RINGERS INFUSION - SIMPLE MED: 62.5000 mL/h | INTRAVENOUS | Status: DC

## 2012-09-13 MED ORDER — TETANUS-DIPHTH-ACELL PERTUSSIS 5-2.5-18.5 LF-MCG/0.5 IM SUSP
0.5000 mL | Freq: Once | INTRAMUSCULAR | Status: AC
  Administered 2012-09-13: 0.5 mL via INTRAMUSCULAR

## 2012-09-13 MED ORDER — LACTATED RINGERS IV SOLN: 500.0000 mL | Freq: Once | INTRAVENOUS | Status: DC

## 2012-09-13 MED ORDER — DIPHENHYDRAMINE HCL 25 MG PO CAPS: 25.0000 mg | ORAL_CAPSULE | Freq: Four times a day (QID) | ORAL | Status: DC | PRN

## 2012-09-13 MED ORDER — DIBUCAINE 1 % RE OINT: 1.0000 "application " | TOPICAL_OINTMENT | RECTAL | Status: DC | PRN

## 2012-09-13 MED ORDER — IBUPROFEN 600 MG PO TABS
600.0000 mg | ORAL_TABLET | Freq: Four times a day (QID) | ORAL | Status: DC
  Administered 2012-09-13 – 2012-09-14 (×3): 600 mg via ORAL
  Filled 2012-09-13 (×3): qty 1

## 2012-09-13 MED ORDER — ONDANSETRON HCL 4 MG PO TABS: 4.0000 mg | ORAL_TABLET | ORAL | Status: DC | PRN

## 2012-09-13 MED ORDER — LACTATED RINGERS IV SOLN
INTRAVENOUS | Status: DC
  Administered 2012-09-13 (×2): via INTRAVENOUS

## 2012-09-13 MED ORDER — ACETAMINOPHEN 325 MG PO TABS: 650.0000 mg | ORAL_TABLET | ORAL | Status: DC | PRN

## 2012-09-13 MED ORDER — DIPHENHYDRAMINE HCL 50 MG/ML IJ SOLN: 12.5000 mg | INTRAMUSCULAR | Status: DC | PRN

## 2012-09-13 MED ORDER — OXYTOCIN BOLUS FROM INFUSION: 500.0000 mL | INTRAVENOUS | Status: DC

## 2012-09-13 MED ORDER — FENTANYL 2.5 MCG/ML BUPIVACAINE 1/10 % EPIDURAL INFUSION (WH - ANES)
14.0000 mL/h | INTRAMUSCULAR | Status: DC | PRN
  Administered 2012-09-13: 14 mL/h via EPIDURAL
  Filled 2012-09-13: qty 125

## 2012-09-13 MED ORDER — GLYBURIDE 2.5 MG PO TABS
2.5000 mg | ORAL_TABLET | Freq: Every day | ORAL | Status: DC
  Filled 2012-09-13: qty 1

## 2012-09-13 MED ORDER — PENICILLIN G POTASSIUM 5000000 UNITS IJ SOLR
5.0000 10*6.[IU] | Freq: Once | INTRAVENOUS | Status: AC
  Administered 2012-09-13: 5 10*6.[IU] via INTRAVENOUS
  Filled 2012-09-13: qty 5

## 2012-09-13 MED ORDER — LIDOCAINE HCL (PF) 1 % IJ SOLN
INTRAMUSCULAR | Status: DC | PRN
  Administered 2012-09-13 (×4): 4 mL

## 2012-09-13 MED ORDER — EPHEDRINE 5 MG/ML INJ
10.0000 mg | INTRAVENOUS | Status: DC | PRN
  Filled 2012-09-13: qty 2

## 2012-09-13 MED ORDER — SENNOSIDES-DOCUSATE SODIUM 8.6-50 MG PO TABS
2.0000 | ORAL_TABLET | Freq: Every day | ORAL | Status: DC
  Administered 2012-09-14: 2 via ORAL

## 2012-09-13 MED ORDER — BENZOCAINE-MENTHOL 20-0.5 % EX AERO
1.0000 "application " | INHALATION_SPRAY | CUTANEOUS | Status: DC | PRN
  Administered 2012-09-13: 1 via TOPICAL
  Filled 2012-09-13: qty 56

## 2012-09-13 MED ORDER — LANOLIN HYDROUS EX OINT: TOPICAL_OINTMENT | CUTANEOUS | Status: DC | PRN

## 2012-09-13 MED ORDER — ONDANSETRON HCL 4 MG/2ML IJ SOLN: 4.0000 mg | INTRAMUSCULAR | Status: DC | PRN

## 2012-09-13 MED ORDER — EPHEDRINE 5 MG/ML INJ
10.0000 mg | INTRAVENOUS | Status: DC | PRN
  Filled 2012-09-13: qty 4
  Filled 2012-09-13: qty 2

## 2012-09-13 NOTE — H&P (Signed)
Kristen Williams is a 37 y.o. female presenting for induction of labor for gestational diabetes. Maternal Medical History:  Reason for admission: Contractions and nausea.   Contractions: Onset was 1-2 hours ago.   Frequency: regular.   Duration is approximately 2 minutes.   Perceived severity is mild.    Fetal activity: Perceived fetal activity is normal.   Last perceived fetal movement was within the past hour.    Prenatal complications: no prenatal complications Prenatal Complications - Diabetes: type 2. Diabetes is managed by oral agent (dual therapy).     HPI:  37 y.o. [id number] at [redacted]w[redacted]d (by [redacted]w[redacted]d ultrasound) presents for IOL for gestational diabetes A2. Feels contractions every 3-4 minutes, small increase in strength after arriving at hospital. +FM in last few minutes. Denies gush of fluid, bleeding. Has a small headache, denies dizziness, changes in vision, chest pain, shortness of breath, has nausea but no vomiting.  Prenatal course: Care at [facility] - Presents late for genetic - Normal anatomy - Gestational diabetes A2 (glyburide and metformin) - GBS positive OB History   Grav Para Term Preterm Abortions TAB SAB Ect Mult Living   4 2 2  1  1   2     Previous SVD, no complications per patient  Past Medical History  Diagnosis Date  . Umbilical hernia 2008  . H/O postpartum depression, currently pregnant   . Abnormal Pap smear 11/05/2005    ASCUS; paps thereafter normal  . History of gallstones   . Depression     pp depression  . Obesity   . Illiterate   . Anemia   . Gestational diabetes     oral agent   Past Surgical History  Procedure Laterality Date  . Dilation and curettage of uterus  2001  . Hernia repair    . Cholecystectomy N/A 2006   Family History: family history is negative for Cancer, and Birth defects, and Asthma, and Arthritis, and Alcohol abuse, and COPD, and Depression, and Diabetes, and Drug abuse, and Early death, and Hearing loss, and Heart  disease, and Hyperlipidemia, and Hypertension, and Learning disabilities, and Kidney disease, and Mental illness, and Mental retardation, and Miscarriages / Stillbirths, and Stroke, and Vision loss, . Social History:  reports that she has never smoked. She has never used smokeless tobacco. She reports that she does not drink alcohol or use illicit drugs.   Prenatal Transfer Tool  Maternal Diabetes: Yes:  Diabetes Type:  Insulin/Medication controlled Genetic Screening: Declined Maternal Ultrasounds/Referrals: Normal Fetal Ultrasounds or other Referrals:  None Maternal Substance Abuse:  No Significant Maternal Medications:  Meds include: Other:  glyburide, metformin Significant Maternal Lab Results:  Lab values include: Group B Strep positive Other Comments:  None  Review of Systems  Constitutional: Negative for fever and chills.  Eyes: Negative for blurred vision and double vision.  Respiratory: Negative for shortness of breath.   Cardiovascular: Negative for chest pain.  Gastrointestinal: Positive for heartburn and nausea. Negative for vomiting.  Neurological: Positive for headaches. Negative for dizziness.      Height 5\' 3"  (1.6 m), weight 72.576 kg (160 lb). Maternal Exam:  Uterine Assessment: Contraction strength is mild.  Contraction duration is 90 seconds. Contraction frequency is regular.  q73min  Abdomen: Patient reports the following abdominal tenderness: LUQ.  Estimated fetal weight is 7 lbs 14 oz by ultrasound 7/31.   Fetal presentation: vertex  Pelvis: adequate for delivery.   Cervix: Cervix evaluated by digital exam.     Fetal Exam Fetal Monitor  Review: Baseline rate: 150.  Variability: moderate (6-25 bpm).   Pattern: accelerations present and early decelerations.    Fetal State Assessment: Category I - tracings are normal.     Physical Exam  Constitutional: She is oriented to person, place, and time. She appears well-developed and well-nourished.  HENT:   Head: Normocephalic and atraumatic.  Cardiovascular: Normal rate, regular rhythm, normal heart sounds and intact distal pulses.  Exam reveals no gallop and no friction rub.   No murmur heard. Respiratory: Effort normal and breath sounds normal.  GI: There is tenderness in the left upper quadrant.  Neurological: She is alert and oriented to person, place, and time.  Skin: Skin is warm and dry.    Prenatal labs: ABO, Rh: O/Positive/-- (03/18 0000) Antibody: Negative (03/18 0000) Rubella: Immune (03/18 0000) RPR: NON REAC (05/12 1220)  HBsAg: Negative (03/18 0000)  HIV: NON REACTIVE (05/12 1220)  GBS: Positive (07/14 0000)   Assessment/Plan: 37 y.o. [id number] at [redacted]w[redacted]d   Induction of labor for gestational diabetes A2 GBS positive, penicillin prophylaxis Normal L&D orders Will start on pitocin 2x2 Expectant management for SVD   [name] 09/13/2012, 8:57 AM

## 2012-09-13 NOTE — H&P (Signed)
i saw this pt and concur with POC.

## 2012-09-13 NOTE — Anesthesia Procedure Notes (Signed)
Epidural Patient location during procedure: OB Start time: 09/13/2012 12:06 PM  Staffing Performed by: anesthesiologist   Preanesthetic Checklist Completed: patient identified, site marked, surgical consent, pre-op evaluation, timeout performed, IV checked, risks and benefits discussed and monitors and equipment checked  Epidural Patient position: sitting Prep: site prepped and draped and DuraPrep Patient monitoring: continuous pulse ox and blood pressure Approach: midline Injection technique: LOR air  Needle:  Needle type: Tuohy  Needle gauge: 17 G Needle length: 9 cm and 9 Needle insertion depth: 4.5 cm Catheter type: closed end flexible Catheter size: 19 Gauge Catheter at skin depth: 9.5 cm Test dose: negative  Assessment Events: blood not aspirated, injection not painful, no injection resistance, negative IV test and no paresthesia  Additional Notes Discussed risk of headache, infection, bleeding, nerve injury and failed or incomplete block.  Patient voices understanding and wishes to proceed.  Epidural placed easily on first attempt.  No paresthesia.  Patient tolerated procedure well with no apparent complications.  Jasmine December, MD Reason for block:procedure for pain

## 2012-09-13 NOTE — Anesthesia Preprocedure Evaluation (Signed)
Anesthesia Evaluation  Patient identified by MRN, date of birth, ID band Patient awake    Reviewed: Allergy & Precautions, H&P , NPO status , Patient's Chart, lab work & pertinent test results, reviewed documented beta blocker date and time   History of Anesthesia Complications Negative for: history of anesthetic complications  Airway Mallampati: II TM Distance: >3 FB Neck ROM: full    Dental  (+) Teeth Intact   Pulmonary neg pulmonary ROS,  breath sounds clear to auscultation        Cardiovascular negative cardio ROS  Rhythm:regular Rate:Normal     Neuro/Psych PSYCHIATRIC DISORDERS (h/o PP depression) negative neurological ROS     GI/Hepatic negative GI ROS, Neg liver ROS,   Endo/Other  diabetes, Gestational, Oral Hypoglycemic Agents  Renal/GU negative Renal ROS  negative genitourinary   Musculoskeletal   Abdominal   Peds  Hematology negative hematology ROS (+)   Anesthesia Other Findings   Reproductive/Obstetrics (+) Pregnancy                           Anesthesia Physical Anesthesia Plan  ASA: II  Anesthesia Plan: Epidural   Post-op Pain Management:    Induction:   Airway Management Planned:   Additional Equipment:   Intra-op Plan:   Post-operative Plan:   Informed Consent: I have reviewed the patients History and Physical, chart, labs and discussed the procedure including the risks, benefits and alternatives for the proposed anesthesia with the patient or authorized representative who has indicated his/her understanding and acceptance.     Plan Discussed with:   Anesthesia Plan Comments:         Anesthesia Quick Evaluation

## 2012-09-13 NOTE — Progress Notes (Signed)
Patient ID: Kristen Williams, female   DOB: [birth year], 37 y.o.   MRN: [id number] SVE 3/50/-1to-2, AROM cl fluid, IUPC placed without difficulty. FHR pattern reassuring.

## 2012-09-14 NOTE — Progress Notes (Signed)
Post Partum Day 1 Subjective: no complaints, up ad lib, voiding, tolerating PO and + flatus  Objective: Blood pressure 102/64, pulse 60, temperature 97.8 F (36.6 C), temperature source Oral, resp. rate 18, height 5\' 3"  (1.6 m), weight 160 lb (72.576 kg), SpO2 100.00%, unknown if currently breastfeeding.  Physical Exam:  General: alert, cooperative, appears stated age and no distress Lochia: appropriate Uterine Fundus: firm Incision: n/a DVT Evaluation: No evidence of DVT seen on physical exam. Negative Homan's sign.   Recent Labs  09/13/12 0730  HGB 12.4  HCT 36.8    Assessment/Plan: Plan for discharge tomorrow   LOS: 1 day   Wyvonnia Dusky DARLENE 09/14/2012, 6:48 AM

## 2012-09-14 NOTE — Discharge Summary (Cosign Needed)
Obstetric Discharge Summary Reason for Admission: onset of labor Prenatal Procedures: none Intrapartum Procedures: spontaneous vaginal delivery Postpartum Procedures: none Complications-Operative and Postpartum: none Hemoglobin  Date Value Range Status  09/13/2012 12.4  12.0 - 15.0 g/dL Final  05/04/4008 27.2   Final  04/28/2012 11.3   Final     HCT  Date Value Range Status  09/13/2012 36.8  36.0 - 46.0 % Final  04/28/2012 33   Final  04/28/2012 33   Final    Physical Exam:  Exam from Darlene Lawson's Note General: alert, cooperative, appears stated age and no distress  Lochia: appropriate  Uterine Fundus: firm  Incision: n/a  DVT Evaluation: No evidence of DVT seen on physical exam.  Negative Homan's sign.   Discharge Diagnoses: Term Pregnancy-delivered  Discharge Information: Date: 09/14/2012 Activity: unrestricted, pelvic rest and 6wks Diet: routine Medications: PNV and Ibuprofen Condition: stable Instructions: refer to practice specific booklet Discharge to: home Follow-up Information   Follow up with [facility] In 6 weeks. (Call for appt in 4-6wks)    Contact information:   [address] [place] [address] [phone]      Newborn Data: Live born female  Birth Weight: 8 lb 3.4 oz (3725 g) APGAR: 9, 9  Home with mother.  Tawana Scale 09/14/2012, 3:56 PM

## 2012-09-14 NOTE — Anesthesia Postprocedure Evaluation (Signed)
Anesthesia Post Note  Patient: Kristen Williams  Procedure(s) Performed: * No procedures listed *  Anesthesia type: Epidural  Patient location: Mother/Baby  Post pain: Pain level controlled  Post assessment: Post-op Vital signs reviewed  Last Vitals:  Filed Vitals:   09/14/12 0517  BP: 102/64  Pulse: 60  Temp: 36.6 C  Resp: 18    Post vital signs: Reviewed  Level of consciousness:alert  Complications: No apparent anesthesia complications
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
## 2012-09-14 NOTE — Progress Notes (Signed)
UR chart review completed.  

## 2012-09-22 ENCOUNTER — Encounter: Payer: Self-pay | Admitting: *Deleted

## 2012-10-26 ENCOUNTER — Ambulatory Visit: Payer: Self-pay | Admitting: Obstetrics & Gynecology

## 2012-12-04 ENCOUNTER — Ambulatory Visit (INDEPENDENT_AMBULATORY_CARE_PROVIDER_SITE_OTHER): Payer: Medicaid Other | Admitting: Family Medicine

## 2012-12-04 NOTE — Progress Notes (Signed)
I have reviewed the documentation as done by the resident.  Pt is 11 weeks postpartum and doing well.  Pt refused exam but abdomen soft and no organomegaly.  Discussed iud scholarship form and filled out today. She is breast feeding and denies complications. No PP depression.  F/u as needed or once MIRENA arrives and for insertion.

## 2012-12-04 NOTE — Progress Notes (Signed)
  Subjective:     Kristen Williams is a 37 y.o. female who presents for a postpartum visit. She is 11 weeks postpartum following a spontaneous vaginal delivery. I have fully reviewed the prenatal and intrapartum course. The delivery was at 39 gestational weeks. Outcome: spontaneous vaginal delivery. Anesthesia: epidural. Postpartum course has been unremarkable. Baby's course has been unremarkable. Baby is feeding by breast. Bleeding no bleeding. Bowel function is normal. Bladder function is normal. Patient is not sexually active. Contraception method is none.  Patient desires IUD.  Postpartum depression screening: negative.  Review of Systems A comprehensive review of systems was negative other than the following:   Patient reports left breast pain.  Objective:    BP 122/59  Pulse 64  Temp(Src) 97.3 F (36.3 C) (Oral)  Ht 5\' 3"  (1.6 m)  Wt 148 lb (67.132 kg)  BMI 26.22 kg/m2  Breastfeeding? Yes  General:  alert, cooperative and no distress   Breasts:  inspection negative, no nipple discharge or bleeding, no masses or nodularity palpable  Lungs: clear to auscultation bilaterally  Heart:  regular rate and rhythm, S1, S2 normal, no murmur, click, rub or gallop  Abdomen: soft, non-tender; bowel sounds normal; no masses,  no organomegaly   GU:  not evaluated; patient had no tears and declined exam.        Assessment:     11 week postpartum exam.   Plan:    1. Contraception: Patient to fill out scholarship form for Mirena IUD   Follow up for IUD insertion once available.

## 2012-12-04 NOTE — Addendum Note (Signed)
Addended by: Vale Haven on: 12/04/2012 09:33 AM   Modules accepted: Level of Service

## 2012-12-09 ENCOUNTER — Encounter: Payer: Self-pay | Admitting: *Deleted

## 2013-02-17 NOTE — H&P (Signed)
Attestation of Attending Supervision of Advanced Practitioner (PA/CNM/NP): Evaluation and management procedures were performed by the Advanced Practitioner with the resident under my supervision and collaboration.  I have reviewed the Advanced Practitioner's note and chart, and I agree with the management and plan.  Reva BoresPRATT,Osie Merkin S, MD [id number] Attending 02/17/2013 10:07 AM

## 2013-10-20 IMAGING — US US OB DETAIL+14 WK
2 series · 12 of 28 positions shown · non-contrast
Comparison: none

[Series 1: us ob detail +14 wk · 11 of 81 slices shown (1 of 2)]
[im 4/81]
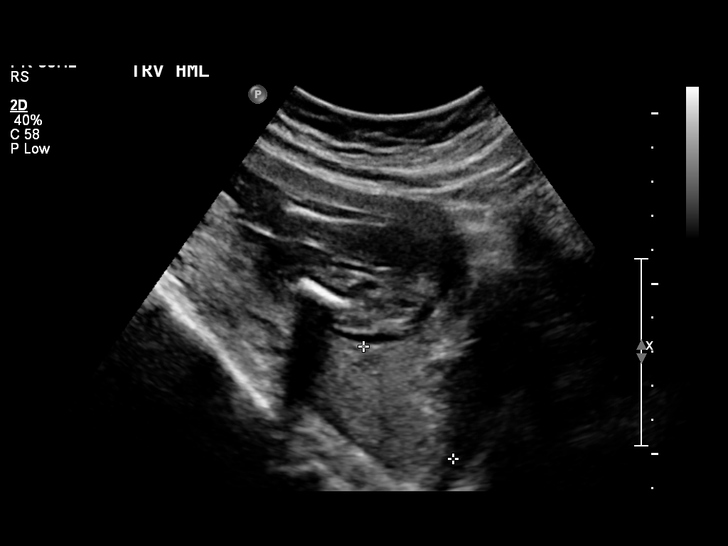
[im 11/81]
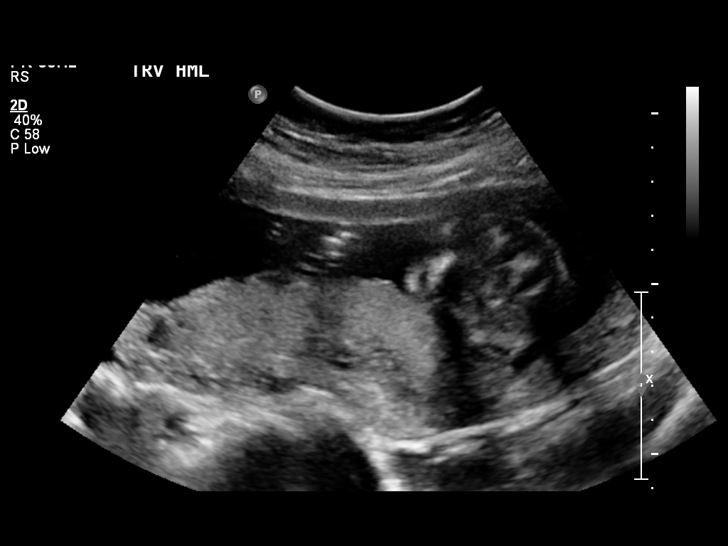
[im 17/81]
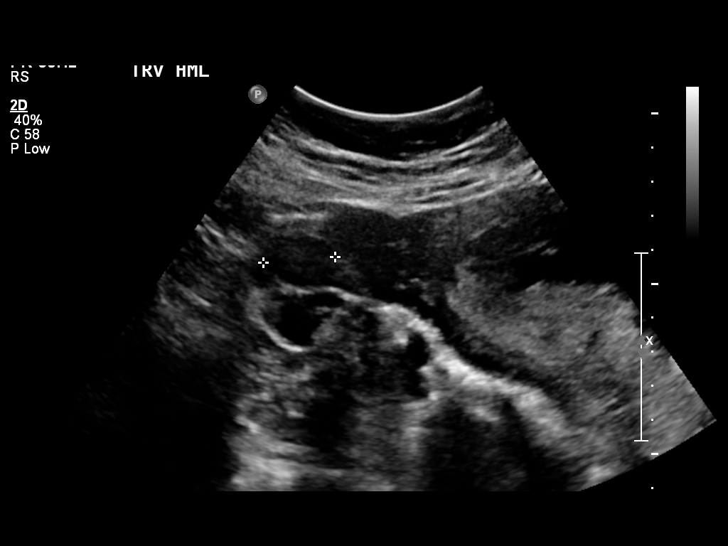
[im 27/81]
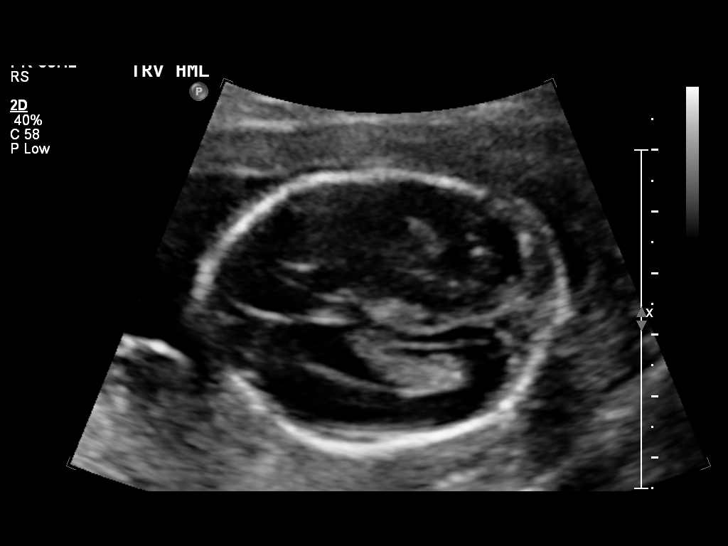
[im 34/81]
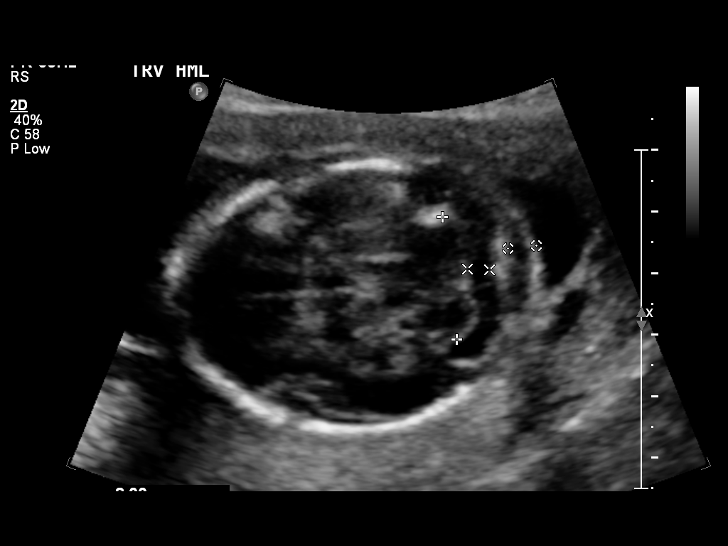
[im 41/81]
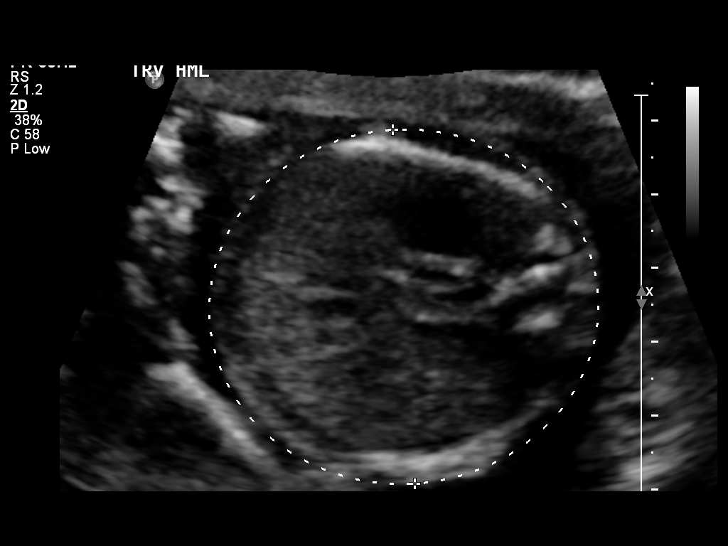
[im 51/81]
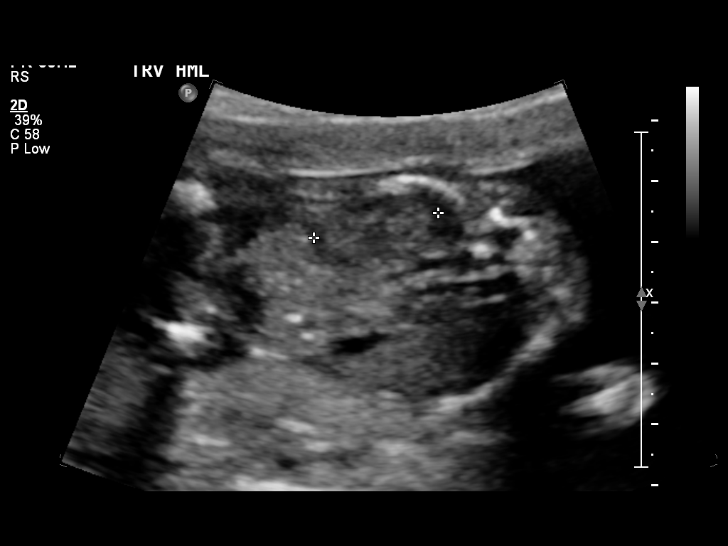
[im 57/81]
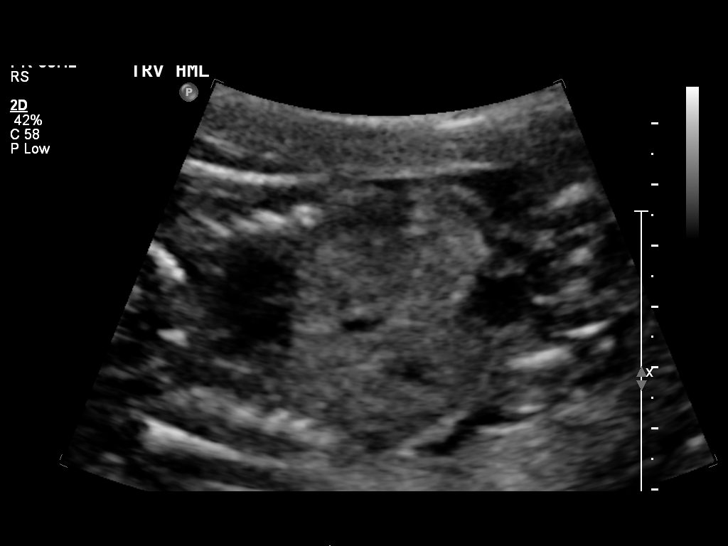
[im 64/81]
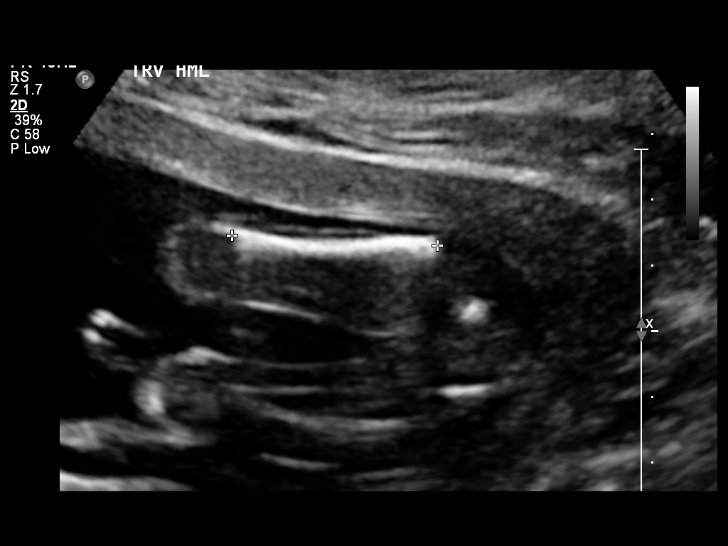
[im 74/81]
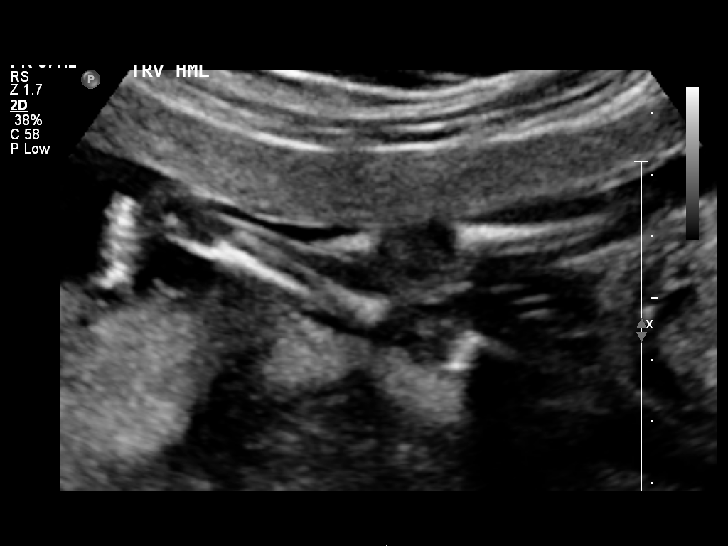
[im 81/81]
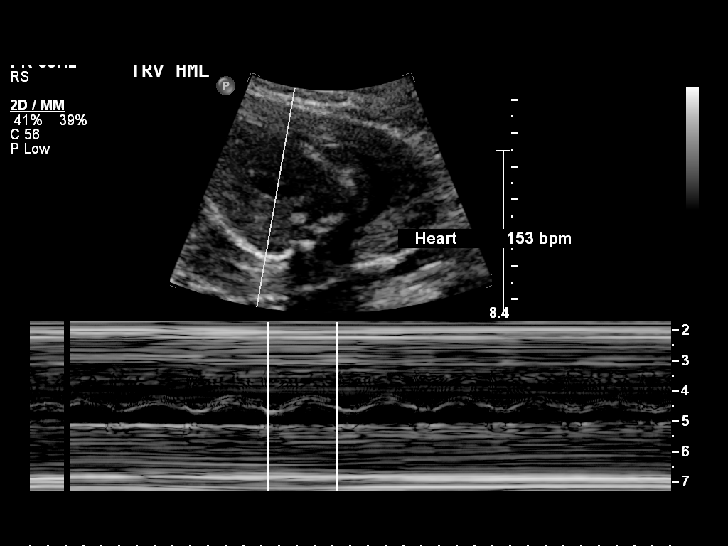

[Series 1: us ob detail +14 wk · 1 of 9 slices shown (2 of 2)]
[im 5/9]
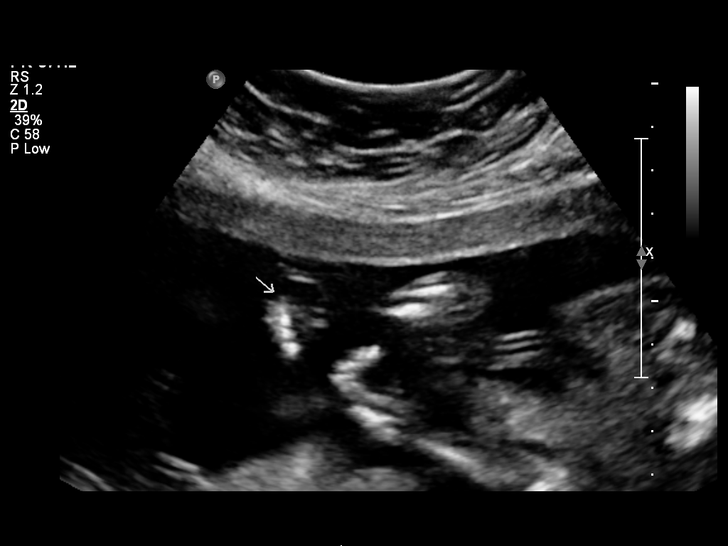

[12 of 28 positions shown; findings below may reference images not displayed]

OBSTETRICS REPORT
                      (Signed Final 05/04/2012 [DATE])

Service(s) Provided

 US OB DETAIL + 14 WK                                  76811.0
Indications

 Detailed fetal anatomic survey
 Unsure of LMP                                         [DATE]
Fetal Evaluation

 Num Of Fetuses:    1
 Fetal Heart Rate:  153                         bpm
 Cardiac Activity:  Observed
 Presentation:      Variable
 Placenta:          Posterior, above cervical
                    os
 P. Cord            Visualized
 Insertion:

 Amniotic Fluid
 AFI FV:      Subjectively within normal limits
                                             Larg Pckt:     4.2  cm
Biometry

 BPD:     44.1  mm    G. Age:   19w 2d                CI:        65.71   70 - 86
                                                      FL/HC:      17.6   18.7 -

 HC:     174.6  mm    G. Age:   20w 0d      < 3  %    HC/AC:      1.11   1.04 -

 AC:     157.6  mm    G. Age:   20w 6d      < 3  %    FL/BPD:     69.6   71 - 87
 FL:      30.7  mm    G. Age:   19w 4d      < 3  %    FL/AC:      19.5   20 - 24
 HUM:     31.4  mm    G. Age:   20w 3d      < 5  %
 CER:       20  mm    G. Age:   19w 0d      < 5  %
 NFT:     4.65  mm

 Est. FW:     338  gm    0 lb 12 oz    < 10  %
Gestational Age

 Clinical EDD:  25w 3d                                        EDD:   08/14/12
 U/S Today:     20w 0d                                        EDD:   09/21/12
 Best:          25w 3d    Det. By:   Clinical EDD             EDD:   08/14/12
Anatomy
 Cranium:          Appears normal         Aortic Arch:      Appears normal
 Fetal Cavum:      Appears normal         Ductal Arch:      Appears normal
 Ventricles:       Appears normal         Diaphragm:        Appears normal
 Choroid Plexus:   Appears normal         Stomach:          Appears normal, left
                                                            sided
 Cerebellum:       Appears normal         Abdomen:          Appears normal
 Posterior Fossa:  Appears normal         Abdominal Wall:   Appears nml (cord
                                                            insert, abd wall)
 Nuchal Fold:      Appears normal         Cord Vessels:     Appears normal (3
                                                            vessel cord)
 Face:             Appears normal         Kidneys:          Appear normal
                   (orbits and profile)
 Lips:             Appears normal         Bladder:          Appears normal
 Heart:            Appears normal         Spine:            Not well visualized
                   (4CH, axis, and
                   situs)
 RVOT:             Not well visualized    Lower             Appears normal
                                          Extremities:
 LVOT:             Not well visualized    Upper             Appears normal
                                          Extremities:

 Other:  5th digit visualized. Fetus appears to be a female. Technically difficult
         due to fetal position.
Targeted Anatomy

 Fetal Central Nervous System
 Lat. Ventricles:  6.2                    Cisterna Magna:
Cervix Uterus Adnexa

 Cervical Length:   4.2       cm

 Cervix:       Normal appearance by transabdominal scan.
 Left Ovary:   Size(cm) L: 3.01 x W: 1.98 x H: 1.74  Volume(cc):
 Right Ovary:  Size(cm) L: 2.35 x W: 2.1 x H: 1.42  Volume(cc):

 Adnexa:     No abnormality visualized.
Impression

 Single live IUP in variable presentation.
 Spine and outflow tracts not well visualized due to fetal
 position.
 Discordant biometry measurements compared to reported
 assigned GA of 16w6d. This would place measurements
 today <10%ile consistent with IUGR. Correlate with outside
 reported prior US dating to verify, and consider reassessment
 in 1-2 weeks for visualization of anatomic structures not seen
 at today's exam.

## 2013-12-13 ENCOUNTER — Encounter (HOSPITAL_COMMUNITY): Payer: Self-pay

## 2014-02-03 IMAGING — US US OB FOLLOW-UP
1 series · 12 of 28 positions shown · non-contrast
Comparison: none

[Series 1: us ob follow up · 12 of 48 slices shown]
[im 2/48]
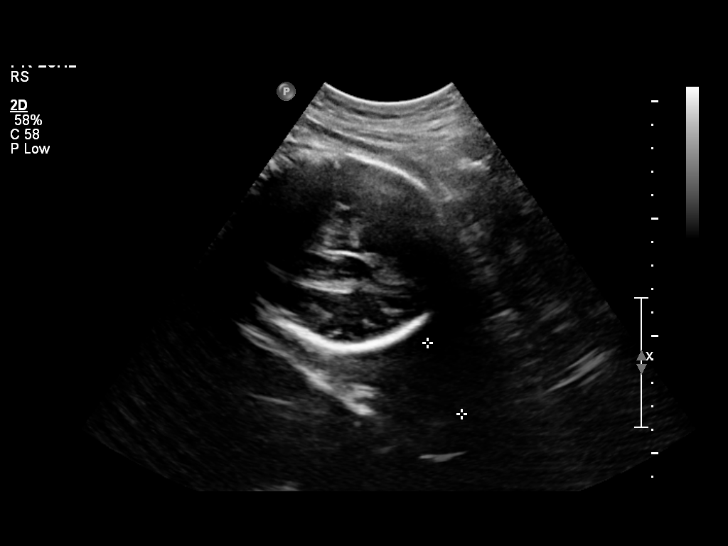
[im 6/48]
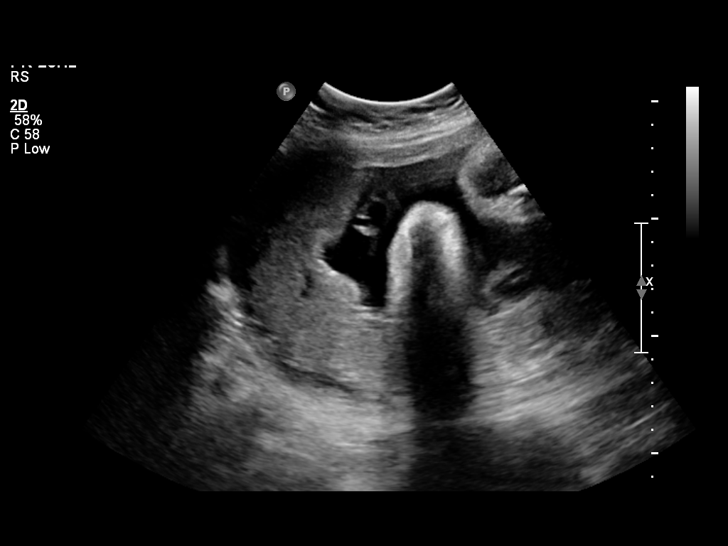
[im 9/48]
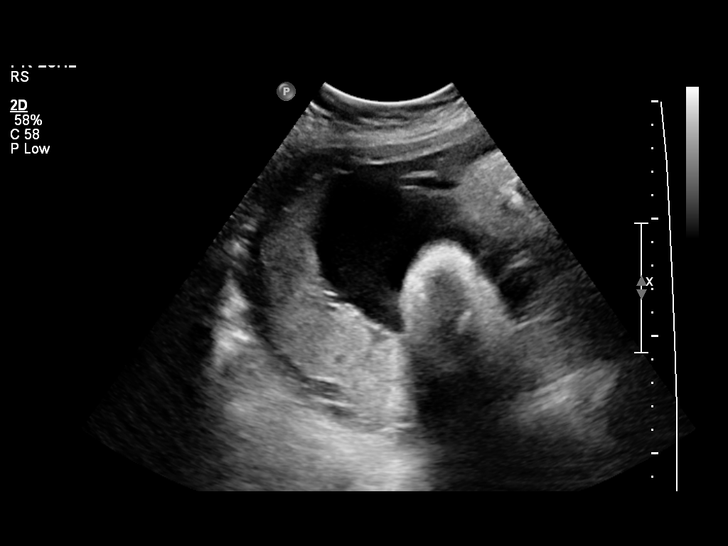
[im 14/48]
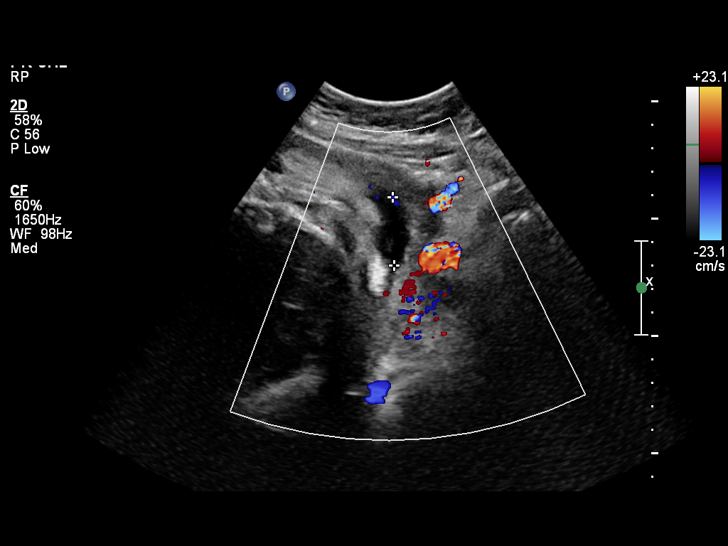
[im 18/48]
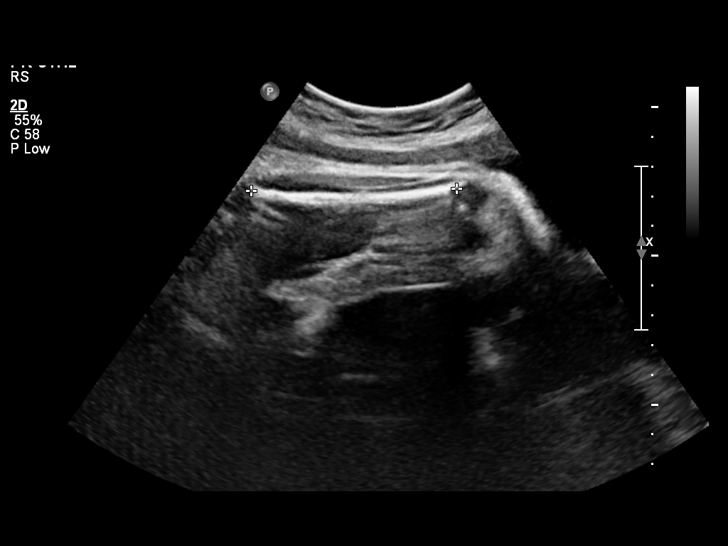
[im 21/48]
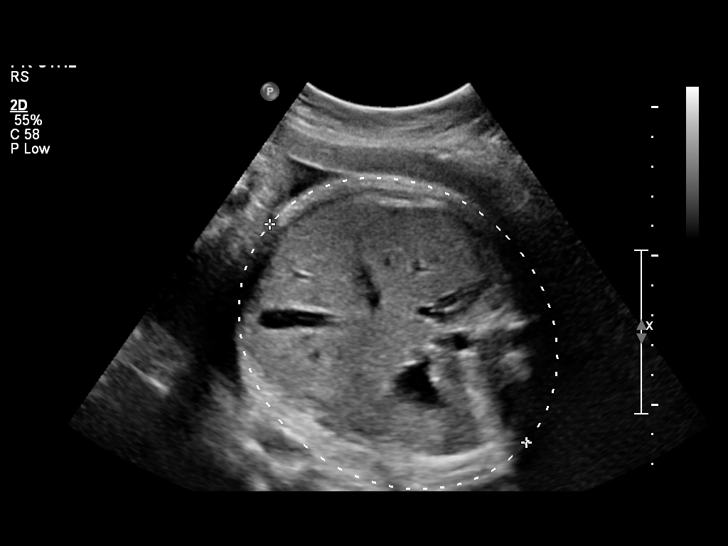
[im 27/48]
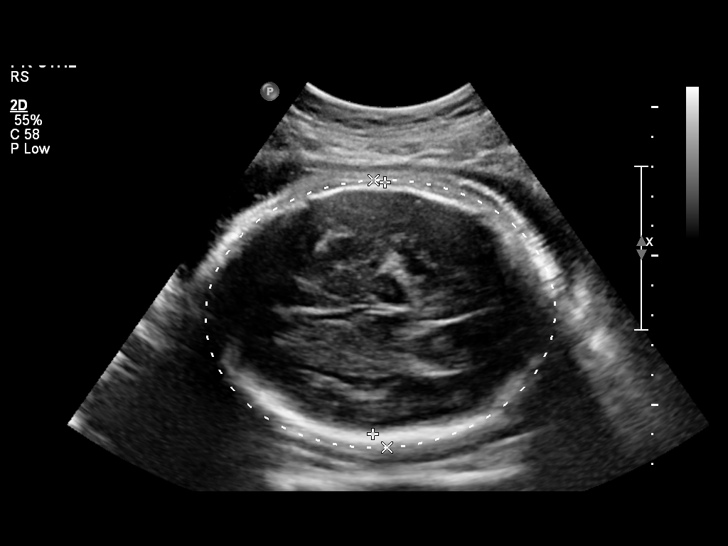
[im 30/48]
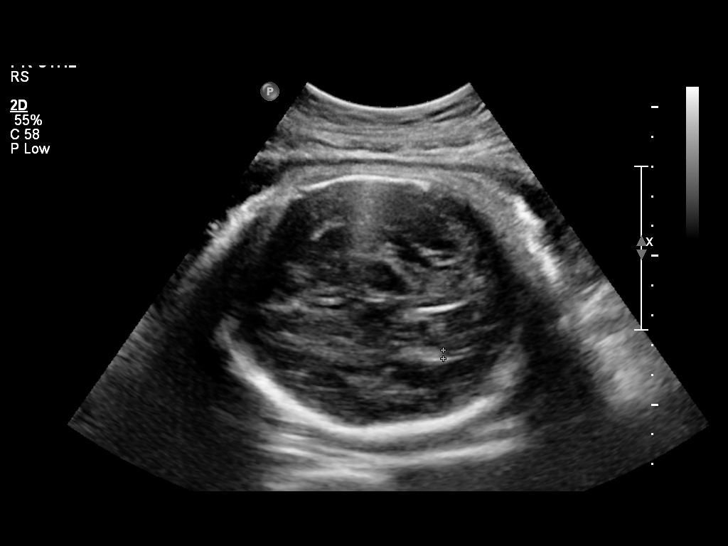
[im 34/48]
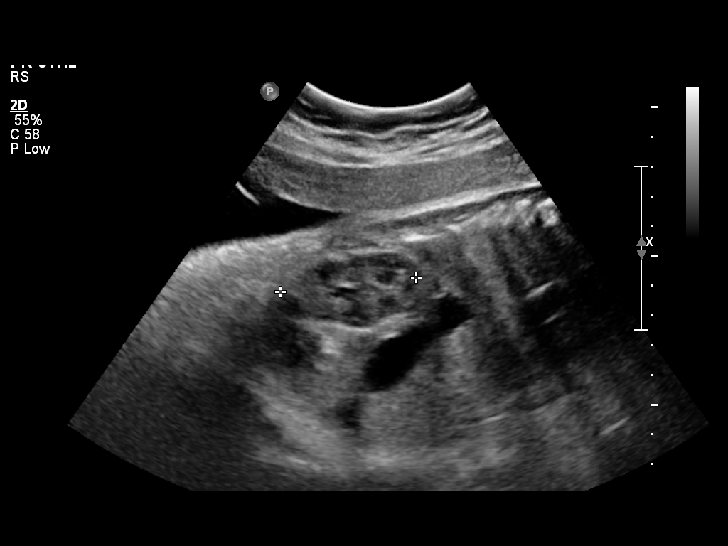
[im 39/48]
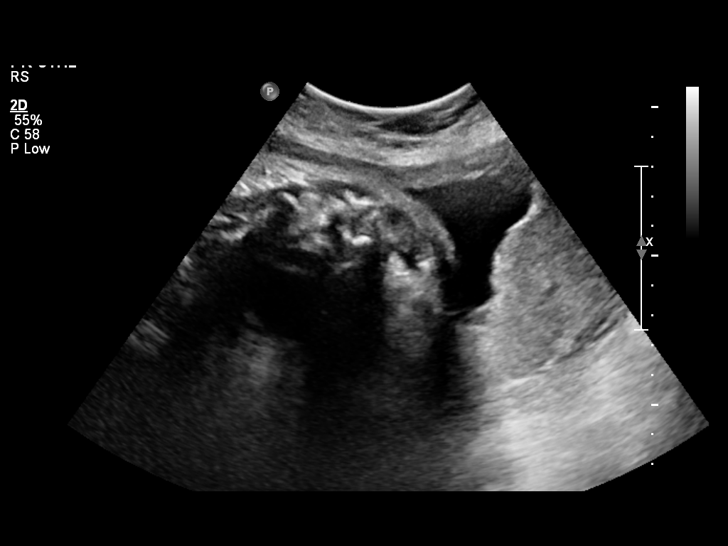
[im 42/48]
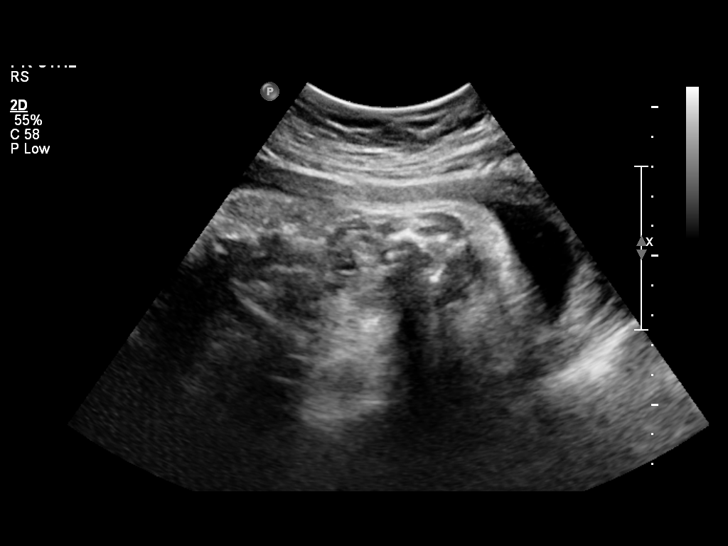
[im 46/48]
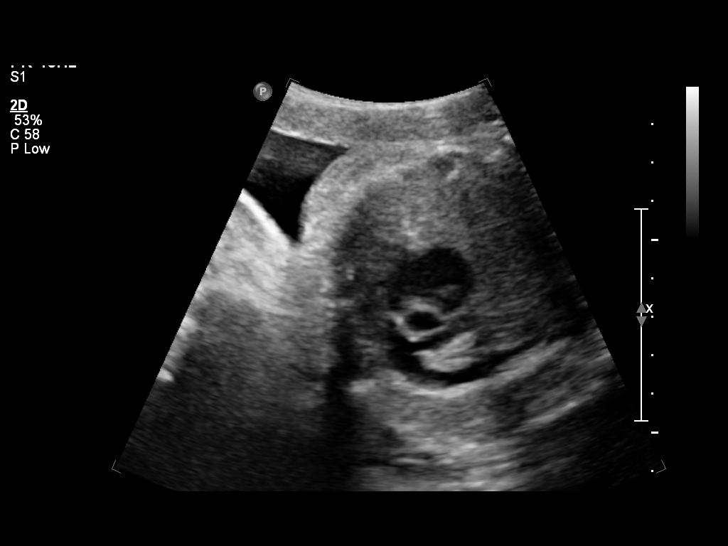

[12 of 28 positions shown; findings below may reference images not displayed]

OBSTETRICS REPORT
                      (Signed Final 08/18/2012 [DATE])

Service(s) Provided

 US OB FOLLOW UP                                       76816.1
Indications

 Diabetes - Gestational, A2 (medication controlled)
 Follow-up incomplete fetal anatomic evaluation
Fetal Evaluation

 Num Of Fetuses:    1
 Fetal Heart Rate:  149                         bpm
 Cardiac Activity:  Observed
 Presentation:      Cephalic
 Placenta:          Posterior, above cervical
                    os
 P. Cord            Previously Visualized
 Insertion:

 Amniotic Fluid
 AFI FV:      Subjectively within normal limits
 AFI Sum:     14      cm      50   %Tile     Larg Pckt:   4.75   cm
 RUQ:   4.75   cm    RLQ:    2.39   cm    LUQ:   3.96    cm   LLQ:    2.9    cm
Biometry

 BPD:     83.1  mm    G. Age:   33w 3d                CI:         67.2   70 - 86
                                                      FL/HC:      21.1   20.1 -

 HC:     324.6  mm    G. Age:   36w 5d       53  %    HC/AC:      0.98   0.93 -

 AC:     332.7  mm    G. Age:   37w 1d       94  %    FL/BPD:     82.4   71 - 87
 FL:      68.5  mm    G. Age:   35w 1d       41  %    FL/AC:      20.6   20 - 24
 HUM:     59.1  mm    G. Age:   34w 1d       45  %

 Est. FW:    4985  gm      6 lb 5 oz     78  %
Gestational Age

 Clinical EDD:  35w 2d                                        EDD:   09/20/12
 U/S Today:     35w 4d                                        EDD:   09/18/12
 Best:          35w 2d    Det. By:   Clinical EDD             EDD:   09/20/12
Anatomy
 Cranium:          Appears normal         Aortic Arch:      Previously seen
 Fetal Cavum:      Previously seen        Ductal Arch:      Previously seen
 Ventricles:       Appears normal         Diaphragm:        Previously seen
 Choroid Plexus:   Previously seen        Stomach:          Appears normal, left
                                                            sided
 Cerebellum:       Previously seen        Abdomen:          Previously seen
 Posterior Fossa:  Previously seen        Abdominal Wall:   Previously seen
 Nuchal Fold:      Previously seen        Cord Vessels:     Previously seen
 Face:             Orbits and profile     Kidneys:          Appear normal
                   previously seen
 Lips:             Previously seen        Bladder:          Appears normal
 Heart:            Previously seen        Spine:            Appears normal
 RVOT:             Appears normal         Lower             Previously seen
                                          Extremities:
 LVOT:             Appears normal         Upper             Previously seen
                                          Extremities:

 Other:  5th digit visualized. Fetus appears to be a female. Technically difficult
         due to fetal position.
Cervix Uterus Adnexa

 Cervical Length:   3.3       cm

 Cervix:       Normal appearance by transabdominal scan.
 Uterus:       No abnormality visualized.
 Left Ovary:   No adnexal mass visualized.
 Right Ovary:  No adnexal mass visualized.
 Adnexa:     No abnormality visualized.
Impression

 Single intrauterine gestation demonstrating an estimated
 gestational age by ultrasound of 35w 4d. This is correlated
 with expected estimated gestational age by EDD (based on
 prior ultrasound) of 35w 2d. EFW is currently at the 78%.

 An improved assessment of the cardiac outflows and spine
 were possible today and these appear normal. No late
 developing fetal anatomic abnormalities are noted associated
 with the lateral ventricles, stomach, kidneys or bladder. The
 four chamber heart could not be reevaluated due to position.

 Subjectively and quantitatively normal amniotic fluid volume.

 Normal cervical length and appearance.

## 2014-02-26 IMAGING — US US OB FOLLOW-UP
1 series · 12 of 22 positions shown · non-contrast
Comparison: none

[Series 1: us ob follow up · 12 of 22 slices shown]
[im 1/22]
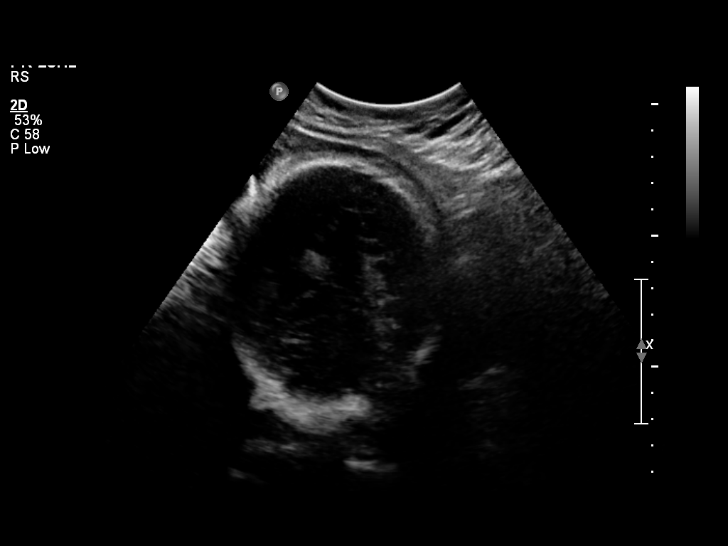
[im 3/22]
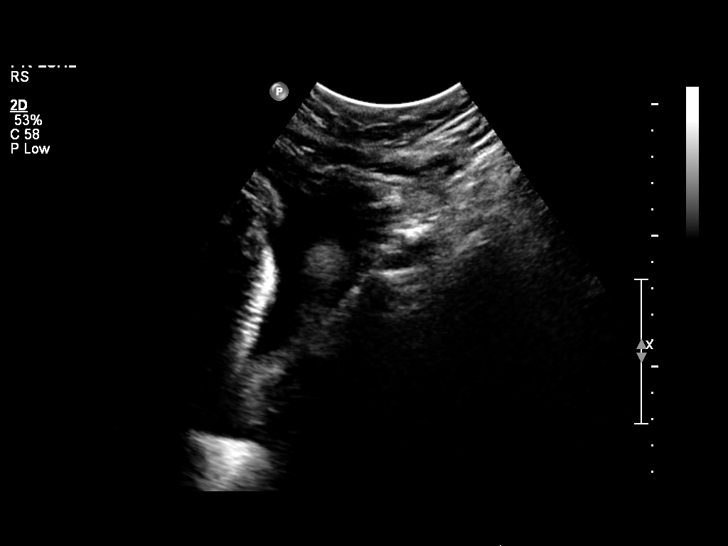
[im 5/22]
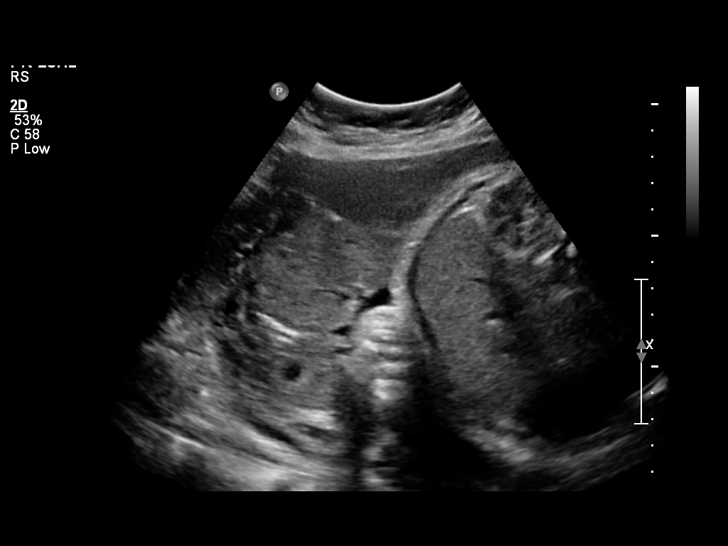
[im 7/22]
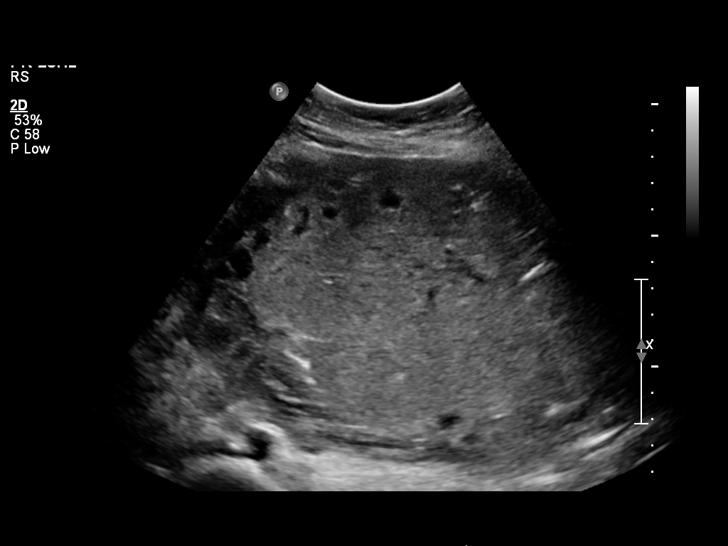
[im 9/22]
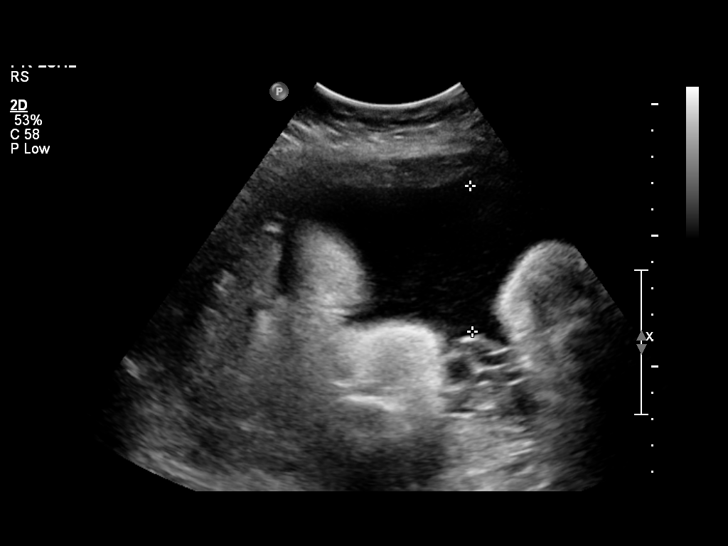
[im 11/22]
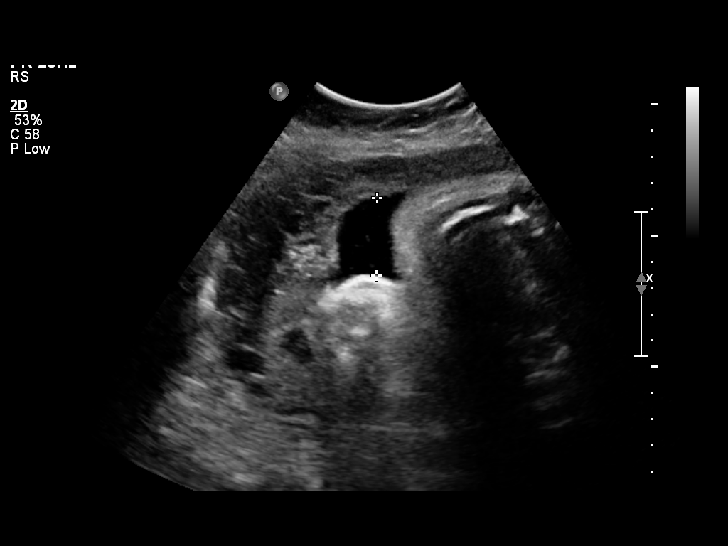
[im 12/22]
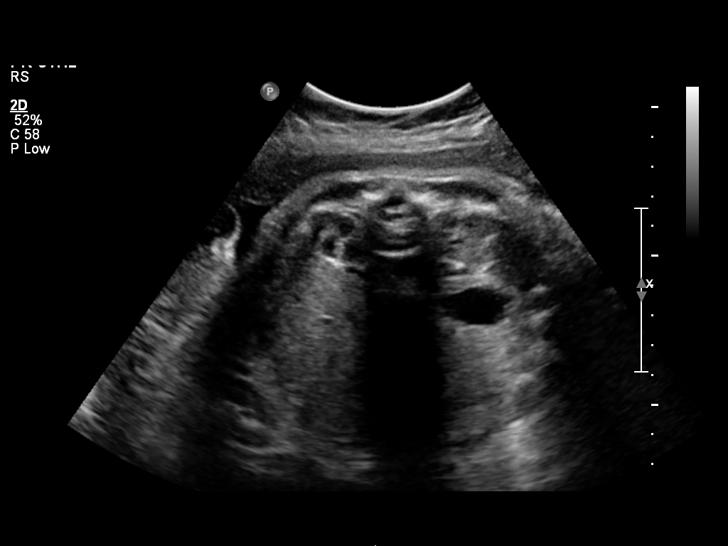
[im 14/22]
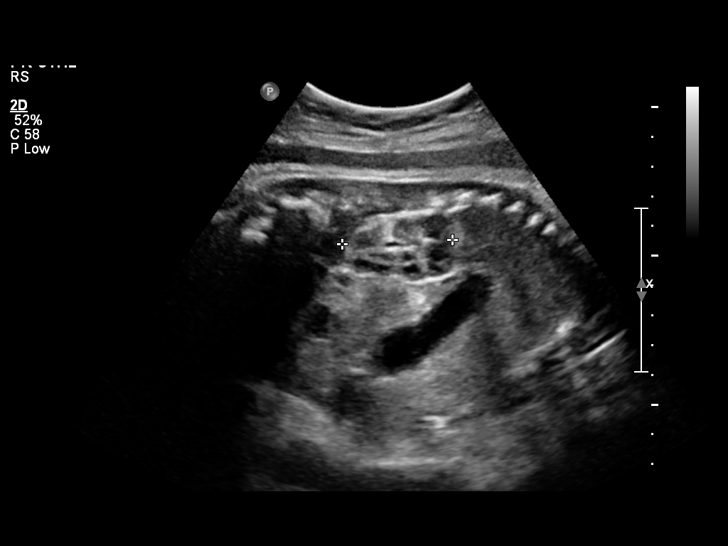
[im 16/22]
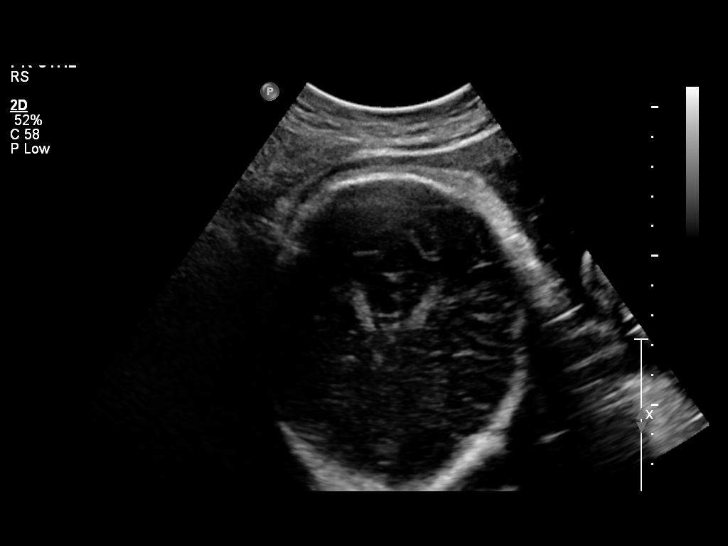
[im 18/22]
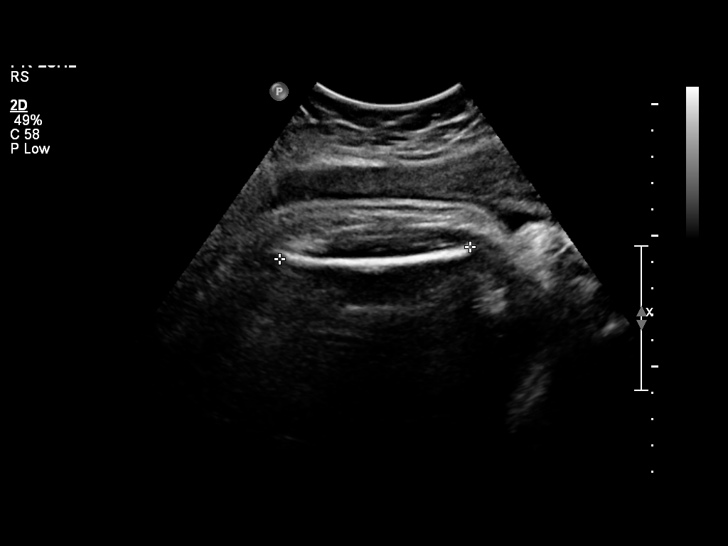
[im 20/22]
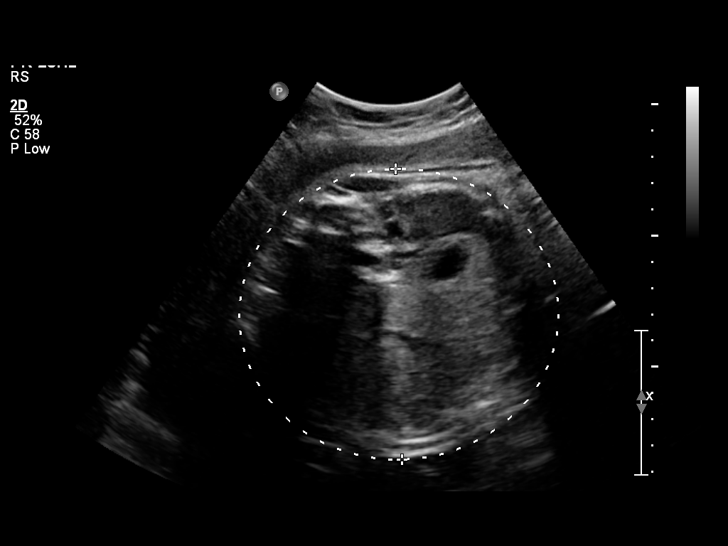
[im 22/22]
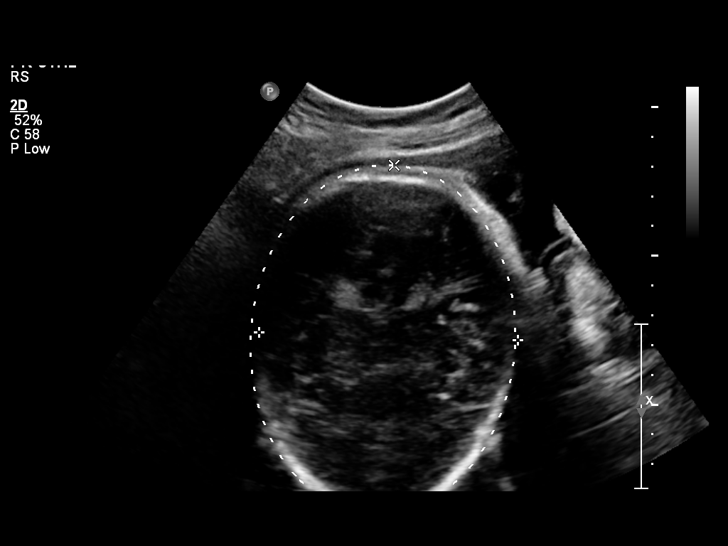

[12 of 22 positions shown; findings below may reference images not displayed]

OBSTETRICS REPORT
                      (Signed Final 09/10/2012 [DATE])

Service(s) Provided

 US OB FOLLOW UP                                       76816.1
Indications

 Diabetes - Gestational, A2 (medication controlled)
Fetal Evaluation

 Num Of Fetuses:    1
 Fetal Heart Rate:  147                         bpm
 Cardiac Activity:  Observed
 Presentation:      Cephalic
 Placenta:          Posterior, above cervical
                    os
 P. Cord            Previously Visualized
 Insertion:

 Amniotic Fluid
 AFI FV:      Subjectively within normal limits
 AFI Sum:     14.63   cm      58   %Tile     Larg Pckt:   5.53   cm
 RUQ:   2.94   cm    RLQ:    4.24   cm    LUQ:   5.53    cm   LLQ:    1.92   cm
Biometry

 BPD:     86.7  mm    G. Age:   35w 0d                CI:        71.61   70 - 86
                                                      FL/HC:      23.0   20.6 -

 HC:     326.2  mm    G. Age:   37w 0d        8  %    HC/AC:      0.90   0.87 -

 AC:     362.7  mm    G. Age:   40w 1d       95  %    FL/BPD:     86.4   71 - 87
 FL:      74.9  mm    G. Age:   38w 2d       49  %    FL/AC:      20.7   20 - 24
 Est. FW:    3594  gm    7 lb 14 oz      84  %
Gestational Age

 Clinical EDD:  38w 4d                                        EDD:   09/20/12
 U/S Today:     37w 4d                                        EDD:   09/27/12
 Best:          38w 4d    Det. By:   Clinical EDD             EDD:   09/20/12
Anatomy

 Cranium:          Appears normal         Aortic Arch:      Previously seen
 Fetal Cavum:      Previously seen        Ductal Arch:      Previously seen
 Ventricles:       Previously seen        Diaphragm:        Previously seen
 Choroid Plexus:   Previously seen        Stomach:          Appears normal, left
                                                            sided
 Cerebellum:       Previously seen        Abdomen:          Previously seen
 Posterior Fossa:  Previously seen        Abdominal Wall:   Previously seen
 Nuchal Fold:      Previously seen        Cord Vessels:     Previously seen
 Face:             Orbits and profile     Kidneys:          Appear normal
                   previously seen
 Lips:             Previously seen        Bladder:          Appears normal
 Heart:            Previously seen        Spine:            Previously seen
 RVOT:             Previously seen        Lower             Previously seen
                                          Extremities:
 LVOT:             Previously seen        Upper             Previously seen
                                          Extremities:

 Other:  5th digit previously visualized. Fetus appears to be a female.
         Technically difficult due to advanced GA and fetal position.
Cervix Uterus Adnexa

 Cervix:       Not visualized (advanced GA >34 wks)

 Adnexa:     No abnormality visualized.
Impression

 Single intrauterine gestation demonstrating an estimated
 gestational age by ultrasound of 37w 4d. This is correlated
 with expected estimated gestational age by clinical EDD of
 38w 4d. The AC is larger than the remaining gestational
 indicators resulting in an EFW at the 84%.

 No late developing fetal anatomic abnormalities are noted
 associated with the stomach, kidneys or bladder. The four
 chamber heart and lateral ventricles could not be well
 assessed due to fetal positioning combined with advanced
 gestational age.

 Subjectively and quantitatively normal amniotic fluid volume.

## 2015-06-20 ENCOUNTER — Encounter (HOSPITAL_COMMUNITY): Payer: Self-pay

## 2015-06-20 ENCOUNTER — Emergency Department (HOSPITAL_COMMUNITY)
Admission: EM | Admit: 2015-06-20 | Discharge: 2015-06-20 | Disposition: A | Discharge status: Home / Self Care | Payer: Self-pay | Attending: Emergency Medicine | Admitting: Emergency Medicine

## 2015-06-20 DIAGNOSIS — Z862 Personal history of diseases of the blood and blood-forming organs and certain disorders involving the immune mechanism: Secondary | ICD-10-CM | POA: Insufficient documentation

## 2015-06-20 DIAGNOSIS — Z8632 Personal history of gestational diabetes: Secondary | ICD-10-CM | POA: Insufficient documentation

## 2015-06-20 DIAGNOSIS — Z8659 Personal history of other mental and behavioral disorders: Secondary | ICD-10-CM | POA: Insufficient documentation

## 2015-06-20 DIAGNOSIS — Z79899 Other long term (current) drug therapy: Secondary | ICD-10-CM | POA: Insufficient documentation

## 2015-06-20 DIAGNOSIS — Z8719 Personal history of other diseases of the digestive system: Secondary | ICD-10-CM | POA: Insufficient documentation

## 2015-06-20 DIAGNOSIS — H109 Unspecified conjunctivitis: Secondary | ICD-10-CM | POA: Insufficient documentation

## 2015-06-20 DIAGNOSIS — E669 Obesity, unspecified: Secondary | ICD-10-CM | POA: Insufficient documentation

## 2015-06-20 MED ORDER — NEOMYCIN-POLYMYXIN-DEXAMETH 3.5-10000-0.1 OP OINT: TOPICAL_OINTMENT | Freq: Four times a day (QID) | OPHTHALMIC | Status: DC

## 2015-06-20 MED ORDER — FLUORESCEIN SODIUM 1 MG OP STRP
1.0000 | ORAL_STRIP | Freq: Once | OPHTHALMIC | Status: AC
  Administered 2015-06-20: 1 via OPHTHALMIC
  Filled 2015-06-20: qty 1

## 2015-06-20 MED ORDER — CIPROFLOXACIN HCL 0.3 % OP SOLN
2.0000 [drp] | OPHTHALMIC | Status: DC
  Administered 2015-06-20: 2 [drp] via OPHTHALMIC
  Filled 2015-06-20: qty 2.5

## 2015-06-20 MED ORDER — TETRACAINE HCL 0.5 % OP SOLN
2.0000 [drp] | Freq: Once | OPHTHALMIC | Status: AC
Start: 2015-06-20 — End: 2015-06-20
  Administered 2015-06-20: 2 [drp] via OPHTHALMIC
  Filled 2015-06-20: qty 2

## 2015-06-20 NOTE — Discharge Instructions (Signed)
Conjuntivitis bacteriana   (Bacterial Conjunctivitis)   La conjuntivitis bacteriana, comúnmente llamada ojo rosado, es una inflamación de la membrana transparente que cubre la parte blanca del ojo (conjuntiva). La inflamación también puede ocurrir en la parte interna de los párpados. Los vasos sanguíneos de la conjuntiva se inflaman haciendo que el ojo se ponga de color rojo o rosado. La conjuntivitis bacteriana puede propagarse fácilmente de un ojo a otro y de una persona a otra (es contagiosa).   CAUSAS   La causa de la conjuntivitis bacteriana es una bacteria. La bacteria puede provenir de la propia piel, del tracto respiratorio superior o de otra persona que padece conjuntivitis bacteriana.   SÍNTOMAS   La parte del ojo o la zona interna del párpado que normalmente son blancas se ven de color rosado o rojo. El ojo rosado se asocia a irritación, lagrimeo y sensibilidad a la luz. La conjuntivitis bacteriana se asocia a una secreción espesa y amarillenta de los ojos. La secreción puede convertirse en una costra en el párpado durante la noche, lo que hace que los párpados se peguen. Si tiene secreción, también puede tener visión borrosa en el ojo afectado.   DIAGNÓSTICO   El diagnóstico de conjuntivitis bacteriana lo realiza el médico con un examen de la vista y por los síntomas que usted refiere. Su médico observará si hay cambios en los tejidos de la superficie de los ojos, los que pueden indicar el tipo específico de conjuntivitis. Tomará una muestra de la secreción con un hisopo de algodón si la conjuntivitis es grave, si la córnea se ve afectada, o si sufre repetidas infecciones que no responden al tratamiento. Luego envía la muestra a un laboratorio para diagnosticar si la causa de la inflamación es una infección bacteriana y para comprobar si responderá a los antibióticos.   TRATAMIENTO   · La conjuntivitis bacteriana se trata con antibióticos. Generalmente se recetan gotas oftálmicas con antibiótico. También  hay ungüentos con antibióticos disponibles. En algunos casos se recetan antibióticos por vía oral. Las lágrimas artificiales o el lavado del ojo pueden aliviar las molestias.  INSTRUCCIONES PARA EL CUIDADO EN EL HOGAR   · Para aliviar el malestar, aplique un paño húmedo, limpio y frío en el ojo durante 10 a 20 minutos, 3 a 4 veces por día.  · Limpie suavemente las secreciones del ojo con un paño tibio y húmedo o una bolita de algodón.  · Lave sus manos frecuentemente con agua y jabón. Use toallas de papel para secarse las manos.  · No comparta toallones ni toallas de mano. Así podrá diseminarse la infección.  · Cambie o lave la funda de la almohada todos los días.  · No use maquillaje en los ojos hasta que la infección haya desaparecido.  · No maneje maquinaria ni conduzca vehículos si su visión es borrosa.  · Deje de usar los entes de contacto. Consulte con su médico si debe esterilizar o reemplazar sus lentes de contacto antes de usarlos de nuevo. Esto depende del tipo de lentes de contacto que use.  · Al aplicarse el medicamento en el ojo infectado, no toque el borde del párpado con el frasco de gotas para los ojos o el tubo de pomada.  SOLICITE ATENCIÓN MÉDICA DE INMEDIATO SI:   · La infección no mejora dentro de los 3 días después de iniciar el tratamiento.  · Tuvo una secreción amarillenta en el ojo y vuelve a aparecer.  · Aumenta el dolor en el ojo.  · El enrojecimiento   se extiende.  · La visión se vuelve borrosa.  · Tiene fiebre o síntomas persistentes durante más de 2 ó 3 días.  · Tiene fiebre y los síntomas empeoran repentinamente.  · Siente dolor, enrojecimiento o hinchazón en el rostro.  ASEGÚRESE DE QUE:   · Comprende estas instrucciones.  · Controlará su enfermedad.  · Solicitará ayuda de inmediato si no mejora o si empeora.     Esta información no tiene como fin reemplazar el consejo del médico. Asegúrese de hacerle al médico cualquier pregunta que tenga.     Document Released: 11/07/2004 Document  Revised: 10/23/2011  Elsevier Interactive Patient Education ©2016 [employer]

## 2015-06-20 NOTE — ED Provider Notes (Signed)
CSN: [id number]     Arrival date & time 06/20/15  4098 History   First MD Initiated Contact with Patient 06/20/15 [phone]     Chief Complaint  Patient presents with  . Eye Pain     (Consider location/radiation/quality/duration/timing/severity/associated sxs/prior Treatment) HPI Comments: Pt states that Kristen Williams feels like Kristen Williams got dust in her left eye yesterday and Kristen Williams had swelling last night. Doesn't wear contact. States that Kristen Williams is having drainage today  Patient is a 40 y.o. female presenting with eye pain. The history is provided by the patient. No language interpreter was used.  Eye Pain This is a new problem. The current episode started today. The problem occurs constantly. The problem has been unchanged. Pertinent negatives include no fever. Nothing aggravates the symptoms. Kristen Williams has tried nothing for the symptoms.    Past Medical History  Diagnosis Date  . Umbilical hernia 2008  . H/O postpartum depression, currently pregnant   . Abnormal Pap smear 11/05/2005    ASCUS; paps thereafter normal  . History of gallstones   . Depression     pp depression  . Obesity   . Illiterate   . Anemia   . Gestational diabetes     oral agent   Past Surgical History  Procedure Laterality Date  . Dilation and curettage of uterus  2001  . Hernia repair    . Cholecystectomy N/A 2006   Family History  Problem Relation Age of Onset  . Cancer Neg Hx   . Birth defects Neg Hx   . Asthma Neg Hx   . Arthritis Neg Hx   . Alcohol abuse Neg Hx   . COPD Neg Hx   . Depression Neg Hx   . Diabetes Neg Hx   . Drug abuse Neg Hx   . Early death Neg Hx   . Hearing loss Neg Hx   . Heart disease Neg Hx   . Hyperlipidemia Neg Hx   . Hypertension Neg Hx   . Learning disabilities Neg Hx   . Kidney disease Neg Hx   . Mental illness Neg Hx   . Mental retardation Neg Hx   . Miscarriages / Stillbirths Neg Hx   . Stroke Neg Hx   . Vision loss Neg Hx    Social History  Substance Use Topics  . Smoking status:  Never Smoker   . Smokeless tobacco: Never Used  . Alcohol Use: No   OB History    Gravida Para Term Preterm AB TAB SAB Ectopic Multiple Living   Review of Systems  Constitutional: Negative for fever.  Eyes: Positive for pain.  All other systems reviewed and are negative.     Allergies  Review of patient's allergies indicates no known allergies.  Home Medications   Prior to Admission medications   Medication Sig Start Date End Date Taking? Authorizing Provider  Prenatal Vit-Fe Fumarate-FA (PRENATAL MULTIVITAMIN) TABS Take 1 tablet by mouth daily at 12 noon. 08/24/12   Reva Bores, MD   BP 120/90 mmHg  Pulse 71  Temp(Src) 98.1 F (36.7 C) (Oral)  Resp 20  SpO2 99%  LMP 06/11/2015 (Approximate) Physical Exam  Constitutional: Kristen Williams is oriented to person, place, and time. Kristen Williams appears well-developed and well-nourished.  HENT:  Right Ear: External ear normal.  Left Ear: External ear normal.  Eyes: EOM are normal. Pupils are equal, round, and reactive to light. Left conjunctiva is injected.  Slit lamp exam:      The right eye shows no corneal abrasion and no foreign body.       The left eye shows no corneal abrasion and no fluorescein uptake.  Cardiovascular: Normal rate and regular rhythm.   Pulmonary/Chest: Effort normal and breath sounds normal.  Musculoskeletal: Normal range of motion.  Neurological: Kristen Williams is alert and oriented to person, place, and time.  Skin: Skin is warm and dry.  Nursing note and vitals reviewed.   ED Course  Procedures (including critical care time) Labs Review Labs Reviewed - No data to display  Imaging Review No results found. I have personally reviewed and evaluated these images and lab results as part of my medical decision-making.   EKG Interpretation None      MDM   Final diagnoses:  Conjunctivitis of left eye    Will treat for conjunctivitis. No sign of abrasion or fb noted    Teressa LowerVrinda Nikaya Nasby,  NP 06/20/15 [id number]  Blane OharaJoshua Zavitz, MD 06/20/15 [phone]

## 2015-06-20 NOTE — ED Notes (Signed)
Pt presents with onset of swelling to L upper eyelid since yesterday.  Pt reports eye was matted closed by drainage this morning, denies any vision change.

## 2015-09-05 ENCOUNTER — Ambulatory Visit (HOSPITAL_COMMUNITY)
Admission: RE | Admit: 2015-09-05 | Discharge: 2015-09-05 | Disposition: A | Discharge status: Home / Self Care | Payer: Self-pay | Source: Ambulatory Visit | Attending: Obstetrics and Gynecology | Admitting: Obstetrics and Gynecology

## 2015-09-05 ENCOUNTER — Encounter (HOSPITAL_COMMUNITY): Payer: Self-pay

## 2015-09-05 VITALS — Ht 61.5 in | Wt 152.0 lb

## 2015-09-05 DIAGNOSIS — N6310 Unspecified lump in the right breast, unspecified quadrant: Secondary | ICD-10-CM

## 2015-09-05 DIAGNOSIS — Z1239 Encounter for other screening for malignant neoplasm of breast: Secondary | ICD-10-CM

## 2015-09-05 NOTE — Progress Notes (Addendum)
Complaints of right breast lump x 2 months that has increased in size. Complaints of right breast spontaneous milky discharge, pain, and redness x 2 months. Patient rates pain at a 5 out of 10.  Pap Smear:  Pap smear not completed today. Last Pap smear was 3 years ago per patient and normal. Patient has a history of an abnormal Pap smear 11/05/2005 that was ASCUS and all Pap smears have been normal since. Patient is scheduled for a Pap smear with [facility] on Friday, October 06, 2015 at 0800. No Pap smear results are in EPIC.  Physical exam: Breasts Breasts symmetrical. No skin abnormalities left breast. Redness observed on the right breast between 11 and 3 o'clock. Bilateral nipple inversion observed that is greater right breast. Per patient the nipple inversion right breast has increased over the past two months. No nipple discharge bilateral breasts. Unable to express nipple discharge from the right breast on exam. No lymphadenopathy. No lumps palpated left breast breast. Palpated a right breast mass between 11 and 3 o'clock under area of redness on exam. Complaints of tenderness when palpated breast mass. Referred patient to [facility] for a right breast biopsy per recommendation. Appointment scheduled for Tuesday, September 05, 2015 at 1330.        Pelvic/Bimanual No Pap smear completed today. Pap smear is scheduled with [facility] for 10/06/2015 at 0800.  Smoking History: Patient has never smoked.  Patient Navigation: Patient education provided. Access to services provided for patient through [facility] program. Spanish interpreter provided. Used Spanish interpreter/Office Manager Al Pimple from [facility] to interpret and chaperone.

## 2015-09-07 NOTE — Patient Instructions (Signed)
Explained self breast awareness to [employer]. Scheduled patient for a Pap smear with the [facility] clinic on Friday, October 06, 2015 at 0800 at the [facility] [place]. Let her know [facility] will cover Pap smears every 3 years unless has a history of abnormal Pap smears. Referred patient to [facility] for a right breast biopsy per recommendation. Appointment scheduled for Tuesday, September 05, 2015 at 1330. Corita Castro-Castillo verbalized understanding.  Danai Gotto, Kathaleen Maser, RN 2:55 PM

## 2015-09-12 DIAGNOSIS — R252 Cramp and spasm: Secondary | ICD-10-CM

## 2015-09-12 DIAGNOSIS — N6019 Diffuse cystic mastopathy of unspecified breast: Secondary | ICD-10-CM

## 2015-09-12 HISTORY — DX: Diffuse cystic mastopathy of unspecified breast: N60.19

## 2015-09-12 HISTORY — DX: Cramp and spasm: R25.2

## 2015-09-15 ENCOUNTER — Ambulatory Visit: Payer: Self-pay | Admitting: Surgery

## 2015-09-15 NOTE — H&P (Signed)
Kristen Williams 09/15/2015 9:34 AM Location: [facility] Patient #: [id number] DOB: [birth year] Married / Language: Spanish / Race: Refused to Report/Unreported Female   History of Present Illness Kristen Fus A. Jailani Hogans MD; 09/15/2015 12:09 PM) Patient words: Patient sent at the request of [facility] radiology for right breast mastitis. Patient relates with the help of her translator provided by the hospital a 2 month history of right breast swelling, pain and discomfort. I don't have all of her medical records and I'm not sure she is been on antibiotics yet. Denies fever or chills. Was evaluated at the [facility] and found to have a 5 cm mass. There is no evidence of abscess. Core biopsy showed inflammation and granuloma. She was sent in consultation for this. She is not breast-feeding. Denies family history of breast cancer.  The patient is a 40 year old female who presents with a complaint of Breast problems.   Other Problems Ethlyn Gallery, CMA; 09/15/2015 9:34 AM) Chest pain Umbilical Hernia Repair  Past Surgical History Elease Hashimoto Spillers, CMA; 09/15/2015 9:34 AM) Liver Surgery Ventral / Umbilical Hernia Surgery Bilateral.  Diagnostic Studies History Elease Hashimoto Spillers, CMA; 09/15/2015 9:34 AM) Mammogram within last year Pap Smear 1-5 years ago  Allergies Elease Hashimoto Spillers, CMA; 09/15/2015 9:35 AM) No Known Drug Allergies08/05/2015  Medication History (Alisha Spillers, CMA; 09/15/2015 9:35 AM) No Current Medications Medications Reconciled  Social History Ethlyn Gallery, CMA; 09/15/2015 9:34 AM) Alcohol use Occasional alcohol use. Caffeine use Coffee. No drug use Tobacco use Never smoker.  Pregnancy / Birth History Ethlyn Gallery, CMA; 09/15/2015 9:34 AM) Age at menarche 12 years. Contraceptive History Contraceptive implant. Gravida 3 Length (months) of breastfeeding 3-6 Maternal age 63-30 Para 3 Regular periods    Review of Systems  Elease Hashimoto Spillers CMA; 09/15/2015 9:34 AM) General Present- Chills, Fever and Night Sweats. Not Present- Appetite Loss, Fatigue, Weight Gain and Weight Loss. Skin Present- New Lesions and Rash. Not Present- Change in Wart/Mole, Dryness, Hives, Jaundice, Non-Healing Wounds and Ulcer. HEENT Present- Earache, Ringing in the Ears, Seasonal Allergies and Yellow Eyes. Not Present- Hearing Loss, Hoarseness, Nose Bleed, Oral Ulcers, Sinus Pain, Sore Throat, Visual Disturbances and Wears glasses/contact lenses. Respiratory Present- Snoring. Not Present- Bloody sputum, Chronic Cough, Difficulty Breathing and Wheezing. Breast Present- Breast Mass, Breast Pain, Nipple Discharge and Skin Changes. Cardiovascular Present- Chest Pain, Leg Cramps and Swelling of Extremities. Not Present- Difficulty Breathing Lying Down, Palpitations, Rapid Heart Rate and Shortness of Breath. Gastrointestinal Not Present- Abdominal Pain, Bloating, Bloody Stool, Change in Bowel Habits, Chronic diarrhea, Constipation, Difficulty Swallowing, Excessive gas, Gets full quickly at meals, Hemorrhoids, Indigestion, Nausea, Rectal Pain and Vomiting. Female Genitourinary Not Present- Frequency, Nocturia, Painful Urination, Pelvic Pain and Urgency. Musculoskeletal Present- Joint Pain. Not Present- Back Pain, Joint Stiffness, Muscle Pain, Muscle Weakness and Swelling of Extremities. Neurological Present- Headaches and Weakness. Not Present- Decreased Memory, Fainting, Numbness, Seizures, Tingling, Tremor and Trouble walking. Psychiatric Not Present- Anxiety, Bipolar, Change in Sleep Pattern, Depression, Fearful and Frequent crying. Endocrine Not Present- Cold Intolerance, Excessive Hunger, Hair Changes, Heat Intolerance, Hot flashes and New Diabetes. Hematology Not Present- Blood Thinners, Easy Bruising, Excessive bleeding, Gland problems, HIV and Persistent Infections.  Vitals (Alisha Spillers CMA; 09/15/2015 9:35 AM) 09/15/2015 9:35 AM Weight: 152  lb Height: 61in Body Surface Area: 1.68 m Body Mass Index: 28.72 kg/m  Pulse: 70 (Regular)  BP: 100/70 (Sitting, Left Arm, Standard)       Physical Exam (Lakeeta Dobosz A. Jacobey Gura MD; 09/15/2015 12:09 PM) General Mental Status-Alert.  General Appearance-Consistent with stated age. Hydration-Well hydrated. Voice-Normal.  Head and Neck Head-normocephalic, atraumatic with no lesions or palpable masses. Trachea-midline. Thyroid Gland Characteristics - normal size and consistency.  Eye Eyeball - Bilateral-Extraocular movements intact. Sclera/Conjunctiva - Bilateral-No scleral icterus.  Breast Note: Right nipple inverted. Erythema and fullness for about 5 cm surrounding the nipple areolar complex. No abscess. No right axillary adenopathy. Left breast normal.   Neurologic Neurologic evaluation reveals -alert and oriented x 3 with no impairment of recent or remote memory. Mental Status-Normal.  Lymphatic Head & Neck  General Head & Neck Lymphatics: Bilateral - Description - Normal. Axillary  General Axillary Region: Bilateral - Description - Normal. Tenderness - Non Tender.    Assessment & Plan (Cassity Christian A. Onia Shiflett MD; 09/15/2015 12:10 PM) CHRONIC MASTITIS OF RIGHT BREAST (N60.11) Impression: Start doxycycline milligrams by mouth twice a day  She will require surgical debridement next week since this is been a two-month history without resolution given medical management of previous round of antibiotics.  Risk of bleeding, infection, cosmetic deformity, and the need for other operative procedures discussed as well as potential for occult malignancy. This was really related to the patient by the translator. Patient agrees to proceed.     recommend debridement of right breast Risk of lumpectomy include bleeding, infection, seroma, more surgery, use of seed/wire, wound care, cosmetic deformity and the need for other treatments, death , blood clots, death.  Pt agrees to proceed. Current Plans The anatomy and the physiology was discussed. The pathophysiology and natural history of the disease was discussed. Options were discussed and recommendations were made. Technique, risks, benefits, & alternatives were discussed. Risks such as stroke, heart attack, bleeding, indection, death, and other risks discussed. Questions answered. The patient agrees to proceed.   sent perscription to pharmacy for medine to take twice a day  You are being scheduled for surgery - Our schedulers will call you.  You should hear from our office's scheduling department within 5 working days about the location, date, and time of surgery. We try to make accommodations for patient's preferences in scheduling surgery, but sometimes the OR schedule or the surgeon's schedule prevents us from making those accommodations.  If you have not heard from our office [phone]) in 5 working days, call the office and ask for your surgeon's nurse.  If you have other questions about your diagnosis, plan, or surgery, call the office and ask for your surgeon's nurse.  Pt Education - [facility] Breast Biopsy HCI: discussed with patient and provided information. Started Doxycycline Hyclate 100MG , 1 (one) Capsule two times daily, #20, 09/15/2015, No Refill.   Signed by Dortha Schwalbehomas A Gerldine Suleiman, MD (09/15/2015 12:11 PM)

## 2015-09-19 ENCOUNTER — Encounter (HOSPITAL_BASED_OUTPATIENT_CLINIC_OR_DEPARTMENT_OTHER): Payer: Self-pay | Admitting: *Deleted

## 2015-09-19 ENCOUNTER — Encounter (HOSPITAL_BASED_OUTPATIENT_CLINIC_OR_DEPARTMENT_OTHER)
Admission: RE | Admit: 2015-09-19 | Discharge: 2015-09-19 | Disposition: A | Discharge status: Home / Self Care | Payer: Self-pay | Source: Ambulatory Visit | Attending: Surgery | Admitting: Surgery

## 2015-09-19 DIAGNOSIS — Z01812 Encounter for preprocedural laboratory examination: Secondary | ICD-10-CM | POA: Insufficient documentation

## 2015-09-19 DIAGNOSIS — S025XXA Fracture of tooth (traumatic), initial encounter for closed fracture: Secondary | ICD-10-CM

## 2015-09-19 HISTORY — DX: Fracture of tooth (traumatic), initial encounter for closed fracture: S02.5XXA

## 2015-09-19 LAB — CBC WITH DIFFERENTIAL/PLATELET
BASOS PCT: 0 %
Basophils Absolute: 0 10*3/uL (ref 0.0–0.1)
EOS ABS: 0.2 10*3/uL (ref 0.0–0.7)
Eosinophils Relative: 2 %
HCT: 35.9 % — ABNORMAL LOW (ref 36.0–46.0)
HEMOGLOBIN: 12.1 g/dL (ref 12.0–15.0)
Lymphocytes Relative: 22 %
Lymphs Abs: 1.8 10*3/uL (ref 0.7–4.0)
MCH: 30.6 pg (ref 26.0–34.0)
MCHC: 33.7 g/dL (ref 30.0–36.0)
MCV: 90.7 fL (ref 78.0–100.0)
MONOS PCT: 5 %
Monocytes Absolute: 0.4 10*3/uL (ref 0.1–1.0)
NEUTROS PCT: 71 %
Neutro Abs: 5.9 10*3/uL (ref 1.7–7.7)
Platelets: 355 10*3/uL (ref 150–400)
RBC: 3.96 MIL/uL (ref 3.87–5.11)
RDW: 11.6 % (ref 11.5–15.5)
WBC: 8.2 10*3/uL (ref 4.0–10.5)

## 2015-09-19 LAB — COMPREHENSIVE METABOLIC PANEL
ALK PHOS: 92 U/L (ref 38–126)
ALT: 30 U/L (ref 14–54)
ANION GAP: 7 (ref 5–15)
AST: 22 U/L (ref 15–41)
Albumin: 3.3 g/dL — ABNORMAL LOW (ref 3.5–5.0)
BUN: 11 mg/dL (ref 6–20)
CALCIUM: 8.9 mg/dL (ref 8.9–10.3)
CO2: 26 mmol/L (ref 22–32)
Chloride: 100 mmol/L — ABNORMAL LOW (ref 101–111)
Creatinine, Ser: 0.64 mg/dL (ref 0.44–1.00)
GFR calc Af Amer: >60 mL/min (ref >60)
GFR calc non Af Amer: >60 mL/min (ref >60)
Glucose, Bld: 303 mg/dL — ABNORMAL HIGH (ref 65–99)
POTASSIUM: 3.8 mmol/L (ref 3.5–5.1)
Sodium: 133 mmol/L — ABNORMAL LOW (ref 135–145)
Total Bilirubin: 0.4 mg/dL (ref 0.3–1.2)
Total Protein: 6.8 g/dL (ref 6.5–8.1)

## 2015-09-19 LAB — POCT PREGNANCY, URINE: Preg Test, Ur: NEGATIVE

## 2015-09-19 NOTE — Progress Notes (Signed)
Pt given 8 oz carton of boost breeze with verbal and written instructions to drink by 1100 am morning of surgery, she is drink no other liquid after midnight, Teach Back used.  Daughter translated

## 2015-09-19 NOTE — Pre-Procedure Instructions (Signed)
Raquel will be interpreter for pt., per Judy at [facility]; please call [phone] if surgery time changes. 

## 2015-09-20 ENCOUNTER — Ambulatory Visit (HOSPITAL_BASED_OUTPATIENT_CLINIC_OR_DEPARTMENT_OTHER)
Admission: RE | Admit: 2015-09-20 | Discharge: 2015-09-20 | Disposition: A | Discharge status: Home / Self Care | Payer: Self-pay | Source: Ambulatory Visit | Attending: Surgery | Admitting: Surgery

## 2015-09-20 ENCOUNTER — Encounter (HOSPITAL_BASED_OUTPATIENT_CLINIC_OR_DEPARTMENT_OTHER)
Admission: RE | Disposition: A | Discharge status: Home / Self Care | Payer: Self-pay | Source: Ambulatory Visit | Attending: Surgery

## 2015-09-20 ENCOUNTER — Ambulatory Visit (HOSPITAL_BASED_OUTPATIENT_CLINIC_OR_DEPARTMENT_OTHER): Payer: Self-pay | Admitting: Anesthesiology

## 2015-09-20 ENCOUNTER — Encounter (HOSPITAL_BASED_OUTPATIENT_CLINIC_OR_DEPARTMENT_OTHER): Payer: Self-pay | Admitting: Anesthesiology

## 2015-09-20 DIAGNOSIS — N6011 Diffuse cystic mastopathy of right breast: Secondary | ICD-10-CM | POA: Insufficient documentation

## 2015-09-20 HISTORY — PX: INCISION AND DRAINAGE OF WOUND: SHX1803

## 2015-09-20 HISTORY — DX: Cramp and spasm: R25.2

## 2015-09-20 HISTORY — DX: Fracture of tooth (traumatic), initial encounter for closed fracture: S02.5XXA

## 2015-09-20 HISTORY — DX: Diffuse cystic mastopathy of unspecified breast: N60.19

## 2015-09-20 LAB — GLUCOSE, CAPILLARY: GLUCOSE-CAPILLARY: 213 mg/dL — AB (ref 65–99)

## 2015-09-20 SURGERY — IRRIGATION AND DEBRIDEMENT WOUND
Anesthesia: General | Site: Breast | Laterality: Right

## 2015-09-20 MED ORDER — ONDANSETRON HCL 4 MG/2ML IJ SOLN
INTRAMUSCULAR | Status: AC
  Filled 2015-09-20: qty 2

## 2015-09-20 MED ORDER — LACTATED RINGERS IV SOLN
INTRAVENOUS | Status: DC | PRN
  Administered 2015-09-20: 15:00:00 via INTRAVENOUS

## 2015-09-20 MED ORDER — HYDROMORPHONE HCL 1 MG/ML IJ SOLN
INTRAMUSCULAR | Status: AC
Start: 2015-09-20 — End: 2015-09-20
  Filled 2015-09-20: qty 1

## 2015-09-20 MED ORDER — DEXAMETHASONE SODIUM PHOSPHATE 4 MG/ML IJ SOLN
INTRAMUSCULAR | Status: DC | PRN
  Administered 2015-09-20: 10 mg via INTRAVENOUS

## 2015-09-20 MED ORDER — CEFAZOLIN SODIUM-DEXTROSE 2-4 GM/100ML-% IV SOLN
INTRAVENOUS | Status: AC
  Filled 2015-09-20: qty 100

## 2015-09-20 MED ORDER — MIDAZOLAM HCL 2 MG/2ML IJ SOLN: 1.0000 mg | INTRAMUSCULAR | Status: DC | PRN

## 2015-09-20 MED ORDER — LACTATED RINGERS IV SOLN
INTRAVENOUS | Status: DC
  Administered 2015-09-20 (×2): via INTRAVENOUS

## 2015-09-20 MED ORDER — HYDROMORPHONE HCL 1 MG/ML IJ SOLN
0.2500 mg | INTRAMUSCULAR | Status: DC | PRN
  Administered 2015-09-20 (×3): 0.5 mg via INTRAVENOUS

## 2015-09-20 MED ORDER — 0.9 % SODIUM CHLORIDE (POUR BTL) OPTIME
TOPICAL | Status: DC | PRN
  Administered 2015-09-20: 400 mL

## 2015-09-20 MED ORDER — ONDANSETRON HCL 4 MG/2ML IJ SOLN: 4.0000 mg | Freq: Once | INTRAMUSCULAR | Status: DC | PRN

## 2015-09-20 MED ORDER — MEPERIDINE HCL 25 MG/ML IJ SOLN: 6.2500 mg | INTRAMUSCULAR | Status: DC | PRN

## 2015-09-20 MED ORDER — FENTANYL CITRATE (PF) 100 MCG/2ML IJ SOLN
INTRAMUSCULAR | Status: AC
  Filled 2015-09-20: qty 2

## 2015-09-20 MED ORDER — LIDOCAINE 2% (20 MG/ML) 5 ML SYRINGE
INTRAMUSCULAR | Status: AC
  Filled 2015-09-20: qty 5

## 2015-09-20 MED ORDER — FENTANYL CITRATE (PF) 100 MCG/2ML IJ SOLN: 50.0000 ug | INTRAMUSCULAR | Status: DC | PRN

## 2015-09-20 MED ORDER — GABAPENTIN 300 MG PO CAPS
300.0000 mg | ORAL_CAPSULE | ORAL | Status: AC
  Administered 2015-09-20: 300 mg via ORAL

## 2015-09-20 MED ORDER — CHLORHEXIDINE GLUCONATE CLOTH 2 % EX PADS: 6.0000 | MEDICATED_PAD | Freq: Once | CUTANEOUS | Status: DC

## 2015-09-20 MED ORDER — CELECOXIB 400 MG PO CAPS
400.0000 mg | ORAL_CAPSULE | ORAL | Status: AC
  Administered 2015-09-20: 400 mg via ORAL

## 2015-09-20 MED ORDER — ONDANSETRON 4 MG PO TBDP
ORAL_TABLET | ORAL | Status: AC
  Filled 2015-09-20: qty 1

## 2015-09-20 MED ORDER — MIDAZOLAM HCL 5 MG/5ML IJ SOLN
INTRAMUSCULAR | Status: DC | PRN
  Administered 2015-09-20: 2 mg via INTRAVENOUS

## 2015-09-20 MED ORDER — LIDOCAINE HCL (CARDIAC) 20 MG/ML IV SOLN
INTRAVENOUS | Status: DC | PRN
  Administered 2015-09-20: 30 mg via INTRAVENOUS

## 2015-09-20 MED ORDER — HYDROMORPHONE HCL 1 MG/ML IJ SOLN
INTRAMUSCULAR | Status: AC
  Filled 2015-09-20: qty 1

## 2015-09-20 MED ORDER — PROPOFOL 10 MG/ML IV BOLUS
INTRAVENOUS | Status: AC
Start: 2015-09-20 — End: 2015-09-20
  Filled 2015-09-20: qty 20

## 2015-09-20 MED ORDER — HYDROCODONE-ACETAMINOPHEN 5-325 MG PO TABS: 1.0000 | ORAL_TABLET | Freq: Four times a day (QID) | ORAL | 0 refills | Status: DC | PRN

## 2015-09-20 MED ORDER — BUPIVACAINE-EPINEPHRINE 0.25% -1:200000 IJ SOLN
INTRAMUSCULAR | Status: DC | PRN
  Administered 2015-09-20: 20 mL

## 2015-09-20 MED ORDER — GABAPENTIN 300 MG PO CAPS
ORAL_CAPSULE | ORAL | Status: AC
  Filled 2015-09-20: qty 1

## 2015-09-20 MED ORDER — OXYCODONE HCL 5 MG PO TABS: 5.0000 mg | ORAL_TABLET | Freq: Once | ORAL | Status: DC | PRN

## 2015-09-20 MED ORDER — MIDAZOLAM HCL 2 MG/2ML IJ SOLN
INTRAMUSCULAR | Status: AC
  Filled 2015-09-20: qty 2

## 2015-09-20 MED ORDER — ONDANSETRON 4 MG PO TBDP
4.0000 mg | ORAL_TABLET | Freq: Once | ORAL | Status: AC
  Administered 2015-09-20: 4 mg via ORAL

## 2015-09-20 MED ORDER — ACETAMINOPHEN 500 MG PO TABS
ORAL_TABLET | ORAL | Status: AC
  Filled 2015-09-20: qty 2

## 2015-09-20 MED ORDER — DOXYCYCLINE HYCLATE 50 MG PO CAPS: 50.0000 mg | ORAL_CAPSULE | Freq: Two times a day (BID) | ORAL | 0 refills | Status: DC

## 2015-09-20 MED ORDER — ACETAMINOPHEN 500 MG PO TABS
1000.0000 mg | ORAL_TABLET | ORAL | Status: AC
  Administered 2015-09-20: 1000 mg via ORAL

## 2015-09-20 MED ORDER — FENTANYL CITRATE (PF) 100 MCG/2ML IJ SOLN
INTRAMUSCULAR | Status: DC | PRN
  Administered 2015-09-20: 100 ug via INTRAVENOUS
  Administered 2015-09-20 (×2): 25 ug via INTRAVENOUS

## 2015-09-20 MED ORDER — ONDANSETRON HCL 4 MG/2ML IJ SOLN
INTRAMUSCULAR | Status: DC | PRN
  Administered 2015-09-20: 4 mg via INTRAVENOUS

## 2015-09-20 MED ORDER — DEXTROSE 5 % IV SOLN
3.0000 g | INTRAVENOUS | Status: AC
  Administered 2015-09-20: 2 g via INTRAVENOUS

## 2015-09-20 MED ORDER — GLYCOPYRROLATE 0.2 MG/ML IJ SOLN: 0.2000 mg | Freq: Once | INTRAMUSCULAR | Status: DC | PRN

## 2015-09-20 MED ORDER — OXYCODONE HCL 5 MG/5ML PO SOLN: 5.0000 mg | Freq: Once | ORAL | Status: DC | PRN

## 2015-09-20 MED ORDER — DEXAMETHASONE SODIUM PHOSPHATE 10 MG/ML IJ SOLN
INTRAMUSCULAR | Status: AC
  Filled 2015-09-20: qty 1

## 2015-09-20 MED ORDER — SCOPOLAMINE 1 MG/3DAYS TD PT72: 1.0000 | MEDICATED_PATCH | Freq: Once | TRANSDERMAL | Status: DC | PRN

## 2015-09-20 MED ORDER — CELECOXIB 200 MG PO CAPS
ORAL_CAPSULE | ORAL | Status: AC
  Filled 2015-09-20: qty 2

## 2015-09-20 MED ORDER — PROPOFOL 10 MG/ML IV BOLUS
INTRAVENOUS | Status: DC | PRN
  Administered 2015-09-20: 200 mg via INTRAVENOUS

## 2015-09-20 MED FILL — HYDROCODON-APAP 5-325: 5-325 | 3 days supply | Qty: 25 | Fill #0

## 2015-09-20 SURGICAL SUPPLY — 41 items
BLADE SURG 15 STRL LF DISP TIS (BLADE) ×1 IMPLANT
BLADE SURG 15 STRL SS (BLADE) ×2
CANISTER SUCT 1200ML W/VALVE (MISCELLANEOUS) ×3 IMPLANT
CHLORAPREP W/TINT 26ML (MISCELLANEOUS) ×3 IMPLANT
COVER BACK TABLE 60X90IN (DRAPES) ×3 IMPLANT
COVER MAYO STAND STRL (DRAPES) ×3 IMPLANT
DRAIN CHANNEL 15F RND FF W/TCR (WOUND CARE) ×3 IMPLANT
DRAPE LAPAROTOMY 100X72 PEDS (DRAPES) ×3 IMPLANT
DRAPE UTILITY XL STRL (DRAPES) ×3 IMPLANT
DRSG EMULSION OIL 3X3 NADH (GAUZE/BANDAGES/DRESSINGS) ×3 IMPLANT
DRSG PAD ABDOMINAL 8X10 ST (GAUZE/BANDAGES/DRESSINGS) ×3 IMPLANT
ELECT COATED BLADE 2.86 ST (ELECTRODE) ×3 IMPLANT
ELECT REM PT RETURN 9FT ADLT (ELECTROSURGICAL) ×3
ELECTRODE REM PT RTRN 9FT ADLT (ELECTROSURGICAL) ×1 IMPLANT
EVACUATOR SILICONE 100CC (DRAIN) ×6 IMPLANT
GAUZE SPONGE 4X4 12PLY STRL (GAUZE/BANDAGES/DRESSINGS) ×3 IMPLANT
GAUZE XEROFORM 1X8 LF (GAUZE/BANDAGES/DRESSINGS) ×3 IMPLANT
GLOVE BIOGEL PI IND STRL 7.0 (GLOVE) ×1 IMPLANT
GLOVE BIOGEL PI IND STRL 8 (GLOVE) ×1 IMPLANT
GLOVE BIOGEL PI INDICATOR 7.0 (GLOVE) ×2
GLOVE BIOGEL PI INDICATOR 8 (GLOVE) ×2
GLOVE ECLIPSE 6.5 STRL STRAW (GLOVE) ×3 IMPLANT
GLOVE ECLIPSE 8.0 STRL XLNG CF (GLOVE) ×3 IMPLANT
GOWN STRL REUS W/ TWL LRG LVL3 (GOWN DISPOSABLE) ×2 IMPLANT
GOWN STRL REUS W/TWL LRG LVL3 (GOWN DISPOSABLE) ×4
LIQUID BAND (GAUZE/BANDAGES/DRESSINGS) ×3 IMPLANT
NEEDLE HYPO 25X1 1.5 SAFETY (NEEDLE) ×3 IMPLANT
NS IRRIG 1000ML POUR BTL (IV SOLUTION) ×3 IMPLANT
PACK BASIN DAY SURGERY FS (CUSTOM PROCEDURE TRAY) ×3 IMPLANT
PENCIL BUTTON HOLSTER BLD 10FT (ELECTRODE) ×3 IMPLANT
SLEEVE SCD COMPRESS KNEE MED (MISCELLANEOUS) ×3 IMPLANT
SPONGE LAP 18X18 X RAY DECT (DISPOSABLE) ×3 IMPLANT
SUT ETHILON 2 0 FS 18 (SUTURE) ×3 IMPLANT
SWAB COLLECTION DEVICE MRSA (MISCELLANEOUS) ×3 IMPLANT
SWAB CULTURE ESWAB REG 1ML (MISCELLANEOUS) ×3 IMPLANT
SYR CONTROL 10ML LL (SYRINGE) ×3 IMPLANT
TOWEL OR 17X24 6PK STRL BLUE (TOWEL DISPOSABLE) ×6 IMPLANT
TOWEL OR NON WOVEN STRL DISP B (DISPOSABLE) ×3 IMPLANT
TUBE CONNECTING 20'X1/4 (TUBING) ×1
TUBE CONNECTING 20X1/4 (TUBING) ×2 IMPLANT
YANKAUER SUCT BULB TIP NO VENT (SUCTIONS) ×3 IMPLANT

## 2015-09-20 NOTE — Anesthesia Procedure Notes (Signed)
Procedure Name: LMA Insertion Date/Time: 09/20/2015 3:21 PM Performed by: Genevieve NorlanderLINKA, Kristen Williams Pre-anesthesia Checklist: Patient identified, Emergency Drugs available, Suction available, Patient being monitored and Timeout performed Patient Re-evaluated:Patient Re-evaluated prior to inductionOxygen Delivery Method: Circle system utilized Preoxygenation: Pre-oxygenation with 100% oxygen Intubation Type: IV induction Ventilation: Mask ventilation without difficulty LMA: LMA inserted LMA Size: 4.0 Number of attempts: 1 Airway Equipment and Method: Bite block Placement Confirmation: positive ETCO2 Tube secured with: Tape Dental Injury: Teeth and Oropharynx as per pre-operative assessment

## 2015-09-20 NOTE — Anesthesia Postprocedure Evaluation (Signed)
Anesthesia Post Note  Patient: Electrical engineerLina Williams  Procedure(s) Performed: Procedure(s) (LRB): RIGHT BREAST IRRIGATION AND DEBRIDEMENT (Right)  Patient location during evaluation: PACU Anesthesia Type: General Level of consciousness: awake and alert Pain management: pain level controlled Vital Signs Assessment: post-procedure vital signs reviewed and stable Respiratory status: spontaneous breathing, nonlabored ventilation and respiratory function stable Cardiovascular status: blood pressure returned to baseline and stable Postop Assessment: no signs of nausea or vomiting Anesthetic complications: no    Last Vitals:  Vitals:   09/20/15 1645 09/20/15 1700  BP: (!) 156/86 (!) 141/97  Pulse: 62 69  Resp: 17 15  Temp:      Last Pain:  Vitals:   09/20/15 1630  TempSrc:   PainSc: 9                  [name] A

## 2015-09-20 NOTE — Discharge Instructions (Signed)
Cuidados en el hogar del drenaje con bulbo (Bulb [facility]) Un drenaje con bulbo consiste en un tubo delgado de goma y un bulbo blando, redondo que crea una succin [place]. El tubo de goma se coloca en la zona donde le practicaron la [place]. El bulbo est unido al extremo del tubo y est fuera del cuerpo. El drenaje con bulbo elimina el exceso de lquido que normalmente se acumula en una herida quirrgica despus de la [place]. El color y la cantidad de lquido pueden variar. Inmediatamente despus de la [place], el lquido es de color rojo brillante y es un poco ms espeso que el agua. Puede cambiar gradualmente a un color amarillo o rosa y volverse ms lquido, similar al agua. En general, si la cantidad disminuye a aproximadamente 1o 2cucharadas en 24horas, el mdico le retirar el drenaje. CUIDADOS DIARIOS  Mantenga el bulbo aplanado (comprimido) en todo momento, excepto cuando deba vaciarlo. Al estar plano se crea la succin. Puede aplanar el bulbo apretndolo firmemente en el medio, cerrando luego el tapn.  Mantenga la piel del lugar en el que el tubo entra en la piel seca y [place] con un vendaje (apsito).  Sostenga el tubo con [place], a 1o 2pulgadas (2,5 a 5,1cm) por debajo del lugar de la insercin para que no tire de los puntos. El tubo est suturado en su lugar y no se saldr.  Ajuste el bulbo como le indic el mdico.  Los primeros 3 das despus de la ciruga, habr ms lquido en el bulbo. Vace el bulbo cada vez que se haya llenado hasta la mitad, debido a que el bulbo no crea la suficiente succin si est demasiado lleno. El bulbo tambin podra desbordarse. Anote la cantidad de lquido que retira cada vez que vace el drenaje. Sume la cantidad que retira en 24 horas.  Vace el bulbo a la misma hora todos los das una vez que disminuya la cantidad de lquido y solo tenga que vaciarlo una vez al da. Anote las cantidades y los totales de 24horas para informar a  su mdico. Esto ayuda a su mdico a saber cundo se pueden Chief Strategy Officer tubos. VACIADO DEL DRENAJE CON BULBO Antes de vaciar el bulbo, consiga una taza [place], un trozo de papel, Ardelia Mems lapicera y [employer].  Pase suavemente los dedos por el tubo (barrido) para Chief of Staff drenaje que se encuentra en el tubo hacia el bulbo. Tal vez necesite repetir esta operacin varias veces por da para eliminar los cogulos y tejidos del tubo.  Abra la tapa del bulbo para liberar la succin, con lo cual se inflar. No toque el interior de la tapa.  Pase suavemente los dedos por el tubo (barrido) para Chief of Staff drenaje que se encuentra en el tubo hacia el bulbo.  Mantenga la tapa fuera y vierta el lquido en un recipiente medidor.  Apriete el bulbo para hacer succin.  Vuelva a colocar la tapa.  Controle la cinta adhesiva que sujeta el tubo a la piel. Cuando se afloja, quite el trozo suelto de cinta y [employer]. Luego fije el bulbo a su camisa.  Anote la cantidad de lquido que elimin. Anote la fecha cada vez que vace el drenaje del bulbo. (Si hay 2 bulbos, tenga en cuenta la cantidad de drenaje de cada uno y [place] los totales separados. El mdico querr saber las cantidades totales de cada tubo de drenaje y cul es el que drena ms).  Tire el lquido por el  inodoro y Verizonlvese las manos.  Llame a su mdico cuando tenga menos de 2cucharadas de lquido acumulado en el bulbo de drenaje durante 24horas. Si hay drenaje alrededor del sitio del tubo, cambie los vendajes y Vincentownmantenga el rea seca. Limpie alrededor del tubo con solucin salina estril y coloque una gasa seca alrededor del sitio. Esta gasa debe cambiarse cuando est sucia. Si se mantiene limpio y sin suciedad, igualmente debe cambiarlo a diario.  SOLICITE ATENCIN MDICA SI:  El drenaje tiene mal olor o est turbio.  Tiene fiebre.  El drenaje aumenta en lugar de disminuir.  El tubo se cae.  Tiene hinchazn o enrojecimiento  alrededor del tubo.  Tiene un drenaje en Reynaldo Miniumuna herida quirrgica.  El bulbo no se queda plano despus de vaciarlo. ASEGRESE DE QUE:   Comprende estas instrucciones.  Controlar su afeccin.  Recibir ayuda de inmediato si no mejora o si empeora.   Esta informacin no tiene Theme park managercomo fin reemplazar el consejo del mdico. Asegrese de hacerle al mdico cualquier pregunta que tenga.   Document Released: 01/28/2005 Document Revised: 11/18/2012 Elsevier Interactive Patient Education 2016 [employer]    REMOVE DRESSING IN 48 HOUR RECOVER WITH DRY GAUZE AND CHANGE DAILY   About my Jackson-Pratt Bulb Drain  What is a Jackson-Pratt bulb? A Jackson-Pratt is a soft, round device used to collect drainage. It is connected to a long, thin drainage catheter, which is held in place by one or two small stiches near your surgical incision site. When the bulb is squeezed, it forms a vacuum, forcing the drainage to empty into the bulb.  Emptying the Jackson-Pratt bulb- To empty the bulb: 1. Release the plug on the top of the bulb. 2. Pour the bulb's contents into a measuring container which your nurse will provide. 3. Record the time emptied and amount of drainage. Empty the drain(s) as often as your     doctor or nurse recommends.  Date                  Time                    Amount (Drain 1)                 Amount (Drain  2)  _____________________________________________________________________  _____________________________________________________________________  _____________________________________________________________________  _____________________________________________________________________  _____________________________________________________________________  _____________________________________________________________________  _____________________________________________________________________  _____________________________________________________________________  Squeezing the Jackson-Pratt Bulb- To squeeze the bulb: 1. Make sure the plug at the top of the bulb is open. 2. Squeeze the bulb tightly in your fist. You will hear air squeezing from the bulb. 3. Replace the plug while the bulb is squeezed. 4. Use a safety pin to attach the bulb to your clothing. This will keep the catheter from     pulling at the bulb insertion site.  When to call your doctor- Call your doctor if:  Drain site becomes red, swollen or hot.  You have a fever greater than 101 degrees F.  There is oozing at the drain site.  Drain falls out (apply a guaze bandage over the drain hole and secure it with tape).  Drainage increases daily not related to activity patterns. (You will usually have more drainage when you are active than when you are resting.)  Drainage has a bad odor.   Post Anesthesia Home Care Instructions  Activity: Get plenty of rest for the remainder of the day. A responsible adult should stay with you for 24 hours following the procedure.  For the next 24 hours, DO NOT: -Drive a car -Advertising copywriter -Drink alcoholic beverages -Take any medication unless instructed by your physician -Make any legal decisions or sign important papers.  Meals: Start with liquid foods such as gelatin or soup. Progress to regular foods as tolerated. Avoid greasy, spicy, heavy foods. If nausea  and/or vomiting occur, drink only clear liquids until the nausea and/or vomiting subsides. Call your physician if vomiting continues.  Special Instructions/Symptoms: Your throat may feel dry or sore from the anesthesia or the breathing tube placed in your throat during surgery. If this causes discomfort, gargle with warm salt water. The discomfort should disappear within 24 hours.  If you had a scopolamine patch placed behind your ear for the management of post- operative nausea and/or vomiting:  1. The medication in the patch is effective for 72 hours, after which it should be removed.  Wrap patch in a tissue and discard in the trash. Wash hands thoroughly with soap and water. 2. You may remove the patch earlier than 72 hours if you experience unpleasant side effects which may include dry mouth, dizziness or visual disturbances. 3. Avoid touching the patch. Wash your hands with soap and water after contact with the patch.

## 2015-09-20 NOTE — Transfer of Care (Signed)
Immediate Anesthesia Transfer of Care Note  Patient: Electrical engineerLina Williams  Procedure(s) Performed: Procedure(s): RIGHT BREAST IRRIGATION AND DEBRIDEMENT (Right)  Patient Location: PACU  Anesthesia Type:General  Level of Consciousness: sedated  Airway & Oxygen Therapy: Patient Spontanous Breathing and Patient connected to face mask oxygen  Post-op Assessment: Report given to RN and Post -op Vital signs reviewed and stable  Post vital signs: Reviewed and stable  Last Vitals:  Vitals:   09/20/15 1436  BP: 133/75  Pulse: 68  Resp: 18  Temp: 37.1 C    Last Pain:  Vitals:   09/20/15 1436  TempSrc: Oral  PainSc: 5       Patients Stated Pain Goal: 2 (09/20/15 1436)  Complications: No apparent anesthesia complications

## 2015-09-20 NOTE — Brief Op Note (Signed)
09/20/2015  4:20 PM  PATIENT:  Kristen Williams  40 y.o. female  PRE-OPERATIVE DIAGNOSIS:  CHRONIC RIGHT BREAST MASTITIS  POST-OPERATIVE DIAGNOSIS:  CHRONIC RIGHT BREAST MASTITIS  PROCEDURE:  RIGHT BREAST EXCISIONAL BIOPSY AND DRAINAGE OF ABSCESS   SURGEON:  Surgeon(s) and Role:    Harriette Bouillon* Mariyam Remington, MD - Primary    ASSISTANTS: none   ANESTHESIA:   general  EBL:  Total I/O In: 800 [I.V.:800] Out: 10 [Blood:10]  BLOOD ADMINISTERED:none  DRAINS: none and (15) Jackson-Pratt drain(s) with closed bulb suction in the RIGHT BREAST   LOCAL MEDICATIONS USED:  BUPIVICAINE   SPECIMEN:  Source of Specimen:  RIGHT BREAST  DISPOSITION OF SPECIMEN:  PATHOLOGY  COUNTS:  YES  TOURNIQUET:  * No tourniquets in log *  DICTATION: .Other Dictation: Dictation Number [id number]  PLAN OF CARE: Discharge to home after PACU  PATIENT DISPOSITION:  PACU - hemodynamically stable.   Delay start of Pharmacological VTE agent (>24hrs) due to surgical blood loss or risk of bleeding: not applicable

## 2015-09-20 NOTE — Interval H&P Note (Signed)
History and Physical Interval Note:  09/20/2015 2:43 PM  Kristen LynchLina Castro-Castillo  has presented today for surgery, with the diagnosis of CHRONIC RIGHT BREAST MASTITIS  The various methods of treatment have been discussed with the patient and family. After consideration of risks, benefits and other options for treatment, the patient has consented to  Procedure(s): RIGHT BREAST DEBRIDEMENT (Right) as a surgical intervention .  The patient's history has been reviewed, patient examined, no change in status, stable for surgery.  I have reviewed the patient's chart and labs.  Questions were answered to the patient's satisfaction.     [name]

## 2015-09-20 NOTE — H&P (View-Only) (Signed)
Kristen Williams 09/15/2015 9:34 AM Location: [facility] Patient #: [id number] DOB: 06/27/1975 Married / Language: Spanish / Race: Refused to Report/Unreported Female   History of Present Illness (Devyon Keator A. Tobey Lippard MD; 09/15/2015 12:09 PM) Patient words: Patient sent at the request of [facility] for right breast mastitis. Patient relates with the help of her translator provided by the hospital a 2 month history of right breast swelling, pain and discomfort. I don't have all of her medical records and I'm not sure she is been on antibiotics yet. Denies fever or chills. Was evaluated at the [facility] and found to have a 5 cm mass. There is no evidence of abscess. Core biopsy showed inflammation and granuloma. She was sent in consultation for this. She is not breast-feeding. Denies family history of breast cancer.  The patient is a 39 year old female who presents with a complaint of Breast problems.   Other Problems (Alisha Spillers, CMA; 09/15/2015 9:34 AM) Chest pain Umbilical Hernia Repair  Past Surgical History (Alisha Spillers, CMA; 09/15/2015 9:34 AM) Liver Surgery Ventral / Umbilical Hernia Surgery Bilateral.  Diagnostic Studies History (Alisha Spillers, CMA; 09/15/2015 9:34 AM) Mammogram within last year Pap Smear 1-5 years ago  Allergies (Alisha Spillers, CMA; 09/15/2015 9:35 AM) No Known Drug Allergies08/05/2015  Medication History (Alisha Spillers, CMA; 09/15/2015 9:35 AM) No Current Medications Medications Reconciled  Social History (Alisha Spillers, CMA; 09/15/2015 9:34 AM) Alcohol use Occasional alcohol use. Caffeine use Coffee. No drug use Tobacco use Never smoker.  Pregnancy / Birth History (Alisha Spillers, CMA; 09/15/2015 9:34 AM) Age at menarche 12 years. Contraceptive History Contraceptive implant. Gravida 3 Length (months) of breastfeeding 3-6 Maternal age 26-30 Para 3 Regular periods    Review of Systems  (Alisha Spillers CMA; 09/15/2015 9:34 AM) General Present- Chills, Fever and Night Sweats. Not Present- Appetite Loss, Fatigue, Weight Gain and Weight Loss. Skin Present- New Lesions and Rash. Not Present- Change in Wart/Mole, Dryness, Hives, Jaundice, Non-Healing Wounds and Ulcer. HEENT Present- Earache, Ringing in the Ears, Seasonal Allergies and Yellow Eyes. Not Present- Hearing Loss, Hoarseness, Nose Bleed, Oral Ulcers, Sinus Pain, Sore Throat, Visual Disturbances and Wears glasses/contact lenses. Respiratory Present- Snoring. Not Present- Bloody sputum, Chronic Cough, Difficulty Breathing and Wheezing. Breast Present- Breast Mass, Breast Pain, Nipple Discharge and Skin Changes. Cardiovascular Present- Chest Pain, Leg Cramps and Swelling of Extremities. Not Present- Difficulty Breathing Lying Down, Palpitations, Rapid Heart Rate and Shortness of Breath. Gastrointestinal Not Present- Abdominal Pain, Bloating, Bloody Stool, Change in Bowel Habits, Chronic diarrhea, Constipation, Difficulty Swallowing, Excessive gas, Gets full quickly at meals, Hemorrhoids, Indigestion, Nausea, Rectal Pain and Vomiting. Female Genitourinary Not Present- Frequency, Nocturia, Painful Urination, Pelvic Pain and Urgency. Musculoskeletal Present- Joint Pain. Not Present- Back Pain, Joint Stiffness, Muscle Pain, Muscle Weakness and Swelling of Extremities. Neurological Present- Headaches and Weakness. Not Present- Decreased Memory, Fainting, Numbness, Seizures, Tingling, Tremor and Trouble walking. Psychiatric Not Present- Anxiety, Bipolar, Change in Sleep Pattern, Depression, Fearful and Frequent crying. Endocrine Not Present- Cold Intolerance, Excessive Hunger, Hair Changes, Heat Intolerance, Hot flashes and New Diabetes. Hematology Not Present- Blood Thinners, Easy Bruising, Excessive bleeding, Gland problems, HIV and Persistent Infections.  Vitals (Alisha Spillers CMA; 09/15/2015 9:35 AM) 09/15/2015 9:35 AM Weight: 152  lb Height: 61in Body Surface Area: 1.68 m Body Mass Index: 28.72 kg/m  Pulse: 70 (Regular)  BP: 100/70 (Sitting, Left Arm, Standard)       Physical Exam (Jency Schnieders A. Charod Slawinski MD; 09/15/2015 12:09 PM) General Mental Status-Alert.   General Appearance-Consistent with stated age. Hydration-Well hydrated. Voice-Normal.  Head and Neck Head-normocephalic, atraumatic with no lesions or palpable masses. Trachea-midline. Thyroid Gland Characteristics - normal size and consistency.  Eye Eyeball - Bilateral-Extraocular movements intact. Sclera/Conjunctiva - Bilateral-No scleral icterus.  Breast Note: Right nipple inverted. Erythema and fullness for about 5 cm surrounding the nipple areolar complex. No abscess. No right axillary adenopathy. Left breast normal.   Neurologic Neurologic evaluation reveals -alert and oriented x 3 with no impairment of recent or remote memory. Mental Status-Normal.  Lymphatic Head & Neck  General Head & Neck Lymphatics: Bilateral - Description - Normal. Axillary  General Axillary Region: Bilateral - Description - Normal. Tenderness - Non Tender.    Assessment & Plan (Anndee Connett A. Rokia Bosket MD; 09/15/2015 12:10 PM) CHRONIC MASTITIS OF RIGHT BREAST (N60.11) Impression: Start doxycycline milligrams by mouth twice a day  She will require surgical debridement next week since this is been a two-month history without resolution given medical management of previous round of antibiotics.  Risk of bleeding, infection, cosmetic deformity, and the need for other operative procedures discussed as well as potential for occult malignancy. This was really related to the patient by the translator. Patient agrees to proceed.     recommend debridement of right breast Risk of lumpectomy include bleeding, infection, seroma, more surgery, use of seed/wire, wound care, cosmetic deformity and the need for other treatments, death , blood clots, death.  Pt agrees to proceed. Current Plans The anatomy and the physiology was discussed. The pathophysiology and natural history of the disease was discussed. Options were discussed and recommendations were made. Technique, risks, benefits, & alternatives were discussed. Risks such as stroke, heart attack, bleeding, indection, death, and other risks discussed. Questions answered. The patient agrees to proceed.   sent perscription to pharmacy for medine to take twice a day  You are being scheduled for surgery - Our schedulers will call you.  You should hear from our office's scheduling department within 5 working days about the location, date, and time of surgery. We try to make accommodations for patient's preferences in scheduling surgery, but sometimes the OR schedule or the surgeon's schedule prevents us from making those accommodations.  If you have not heard from our office [phone] in 5 working days, call the office and ask for your surgeon's nurse.  If you have other questions about your diagnosis, plan, or surgery, call the office and ask for your surgeon's nurse.  Pt Education - [facility] Breast Biopsy HCI: discussed with patient and provided information. Started Doxycycline Hyclate 100MG , 1 (one) Capsule two times daily, #20, 09/15/2015, No Refill.   Signed by Dortha Schwalbehomas A Reda Gettis, MD (09/15/2015 12:11 PM)

## 2015-09-20 NOTE — Anesthesia Preprocedure Evaluation (Signed)
Anesthesia Evaluation  Patient identified by MRN, date of birth, ID band Patient awake    Reviewed: Allergy & Precautions, NPO status , Patient's Chart, lab work & pertinent test results  Airway Mallampati: I  TM Distance: >3 FB Neck ROM: Full    Dental  (+) Teeth Intact, Dental Advisory Given   Pulmonary    breath sounds clear to auscultation       Cardiovascular  Rhythm:Regular Rate:Normal     Neuro/Psych    GI/Hepatic   Endo/Other    Renal/GU      Musculoskeletal   Abdominal   Peds  Hematology   Anesthesia Other Findings Hx of gestational diabetes 3 years ago.  Not checked since then.  Reproductive/Obstetrics                             Anesthesia Physical Anesthesia Plan  ASA: I  Anesthesia Plan: General   Post-op Pain Management:    Induction: Intravenous  Airway Management Planned: LMA  Additional Equipment:   Intra-op Plan:   Post-operative Plan: Extubation in OR  Informed Consent: I have reviewed the patients History and Physical, chart, labs and discussed the procedure including the risks, benefits and alternatives for the proposed anesthesia with the patient or authorized representative who has indicated his/her understanding and acceptance.   Dental advisory given  Plan Discussed with: CRNA and Anesthesiologist  Anesthesia Plan Comments:         Anesthesia Quick Evaluation

## 2015-09-21 ENCOUNTER — Encounter (HOSPITAL_BASED_OUTPATIENT_CLINIC_OR_DEPARTMENT_OTHER): Payer: Self-pay | Admitting: Surgery

## 2015-09-21 NOTE — Op Note (Signed)
NAMESAKARA, LEHTINEN        ACCOUNT NO.:  [id number]  MEDICAL RECORD NO.:  [id number]  LOCATION:                                 FACILITY:  PHYSICIAN:  Jolynne Spurgin A. Crystalmarie Yasin, M.D.DATE OF BIRTH:  July 14, 1975  DATE OF PROCEDURE:  09/20/2015 DATE OF DISCHARGE:                              OPERATIVE REPORT   PREOPERATIVE DIAGNOSIS:  Chronic mastitis, right breast.  POSTOPERATIVE DIAGNOSIS:  Chronic mastitis, right breast.  PROCEDURE:  Right breast excisional biopsy with drainage of right breast abscess with 15 round Blake drain.  SURGEON:  Marcello Moores A. Yony Roulston, M.D.  ANESTHESIA:  MAC with 0.25% Sensorcaine local.  EBL:  Minimal.  SPECIMEN:  Will be breast tissue to pathology with cultures as well.  IV FLUIDS:  500 mL crystalloid.  INDICATIONS FOR PROCEDURE:  The patient is a 40 year old, Hispanic female, with a 69-monthhistory of right breast swelling.  [facility].  Core biopsy showed chronic granulomatous change without evidence of malignancy.  She had significant swelling and pain.  She has been off and on antibiotics without resolution of this.  I recommended debridement of her right breast excision biopsy of this area since there was still concern for malignancy given her physical exam findings and mammographic findings.  Risks, benefits, and alternatives were discussed using a SPatent attorney  Risk of bleeding, infection, recurrence, deformity, the need for further surgery, damage to neighboring structures were discussed.  The use of draining and/or packing were discussed.  She agreed to proceed.  DESCRIPTION OF PROCEDURE:  The patient met in the holding area. Questions were answered.  Right breast marked as correct side.  She was taken back to the operating room, placed supine on the OR table.  After induction of general anesthesia, the right breast was prepped and draped in a  sterile fashion.  There was a small sinus tract noted at about 12 o'clock and a red inflamed area.  After time-out was done, a curvilinear incision was made where the sinus tract was.  I opened the cavity, it was a large cavity that contained chronic granulation tissue.  I took some cultures of some turbid fluid within the cavity.  I then cut back the tissue to healthy tissue and sent all this to pathology for further evaluation to exclude malignancy since it had the gross appearance of malignancy.  The cavity was copiously irrigated with saline.  Through a lateral stab incision, a 15 round drain was placed into the cavity.  I then approximated the skin edges with 2-0 nylon and placed Xeroform on top of the sutures.  The drain was placed to bulb suction and secured to the skin with 2-0 nylon.  A large ABD pad was applied.  All final counts were found to be correct.  The patient was awoken, extubated, and taken to recovery in satisfactory condition.     Jacoba Cherney A. Zeba Luby, M.D.   ______________________________ TJoyice Faster Cylinda Santoli, M.D.    TAC/MEDQ  D:  09/20/2015  T:  09/20/2015  Job:  [id number]

## 2015-09-25 LAB — AEROBIC/ANAEROBIC CULTURE W GRAM STAIN (SURGICAL/DEEP WOUND)

## 2015-09-25 LAB — AEROBIC/ANAEROBIC CULTURE (SURGICAL/DEEP WOUND)

## 2015-10-06 ENCOUNTER — Ambulatory Visit (HOSPITAL_COMMUNITY): Payer: Self-pay

## 2015-10-24 ENCOUNTER — Encounter (HOSPITAL_COMMUNITY): Payer: Self-pay | Admitting: *Deleted

## 2020-02-23 ENCOUNTER — Other Ambulatory Visit: Payer: Self-pay

## 2020-02-23 DIAGNOSIS — Z20822 Contact with and (suspected) exposure to covid-19: Secondary | ICD-10-CM

## 2020-02-24 LAB — NOVEL CORONAVIRUS, NAA: SARS-CoV-2, NAA: NOT DETECTED

## 2020-02-24 LAB — SARS-COV-2, NAA 2 DAY TAT

## 2020-04-26 ENCOUNTER — Encounter (HOSPITAL_COMMUNITY): Payer: Self-pay | Admitting: Emergency Medicine

## 2020-04-26 ENCOUNTER — Emergency Department (HOSPITAL_COMMUNITY)
Admission: EM | Admit: 2020-04-26 | Discharge: 2020-04-26 | Disposition: A | Discharge status: Home / Self Care | Payer: Self-pay | Attending: Emergency Medicine | Admitting: Emergency Medicine

## 2020-04-26 ENCOUNTER — Other Ambulatory Visit: Payer: Self-pay

## 2020-04-26 DIAGNOSIS — N898 Other specified noninflammatory disorders of vagina: Secondary | ICD-10-CM | POA: Insufficient documentation

## 2020-04-26 DIAGNOSIS — R319 Hematuria, unspecified: Secondary | ICD-10-CM

## 2020-04-26 DIAGNOSIS — N390 Urinary tract infection, site not specified: Secondary | ICD-10-CM | POA: Insufficient documentation

## 2020-04-26 DIAGNOSIS — R739 Hyperglycemia, unspecified: Secondary | ICD-10-CM | POA: Insufficient documentation

## 2020-04-26 LAB — URINALYSIS, ROUTINE W REFLEX MICROSCOPIC
Bilirubin Urine: NEGATIVE
Glucose, UA: >=500 mg/dL — AB
Hgb urine dipstick: NEGATIVE
Ketones, ur: 80 mg/dL — AB
Nitrite: NEGATIVE
Protein, ur: 30 mg/dL — AB
Specific Gravity, Urine: 1.043 — ABNORMAL HIGH (ref 1.005–1.030)
WBC, UA: >50 WBC/hpf — ABNORMAL HIGH (ref 0–5)
pH: 5 (ref 5.0–8.0)

## 2020-04-26 LAB — COMPREHENSIVE METABOLIC PANEL
ALT: 44 U/L (ref 0–44)
AST: 71 U/L — ABNORMAL HIGH (ref 15–41)
Albumin: 3.2 g/dL — ABNORMAL LOW (ref 3.5–5.0)
Alkaline Phosphatase: 118 U/L (ref 38–126)
Anion gap: 10 (ref 5–15)
BUN: 17 mg/dL (ref 6–20)
CO2: 20 mmol/L — ABNORMAL LOW (ref 22–32)
Calcium: 8.1 mg/dL — ABNORMAL LOW (ref 8.9–10.3)
Chloride: 103 mmol/L (ref 98–111)
Creatinine, Ser: 0.48 mg/dL (ref 0.44–1.00)
GFR, Estimated: >60 mL/min (ref >60)
Glucose, Bld: 281 mg/dL — ABNORMAL HIGH (ref 70–99)
Potassium: 3.3 mmol/L — ABNORMAL LOW (ref 3.5–5.1)
Sodium: 133 mmol/L — ABNORMAL LOW (ref 135–145)
Total Bilirubin: 1 mg/dL (ref 0.3–1.2)
Total Protein: 6.3 g/dL — ABNORMAL LOW (ref 6.5–8.1)

## 2020-04-26 LAB — LIPASE, BLOOD: Lipase: 25 U/L (ref 11–51)

## 2020-04-26 LAB — CBC
HCT: 41.9 % (ref 36.0–46.0)
Hemoglobin: 13.7 g/dL (ref 12.0–15.0)
MCH: 31.3 pg (ref 26.0–34.0)
MCHC: 32.7 g/dL (ref 30.0–36.0)
MCV: 95.7 fL (ref 80.0–100.0)
Platelets: 270 10*3/uL (ref 150–400)
RBC: 4.38 MIL/uL (ref 3.87–5.11)
RDW: 11.6 % (ref 11.5–15.5)
WBC: 10.9 10*3/uL — ABNORMAL HIGH (ref 4.0–10.5)
nRBC: 0 % (ref 0.0–0.2)

## 2020-04-26 LAB — I-STAT BETA HCG BLOOD, ED (MC, WL, AP ONLY): I-stat hCG, quantitative: <5 m[IU]/mL (ref <5)

## 2020-04-26 LAB — TROPONIN I (HIGH SENSITIVITY)
Troponin I (High Sensitivity): 6 ng/L (ref <18)
Troponin I (High Sensitivity): 4 ng/L (ref <18)

## 2020-04-26 MED ORDER — ONDANSETRON HCL 4 MG/2ML IJ SOLN
4.0000 mg | Freq: Once | INTRAMUSCULAR | Status: AC
Start: 2020-04-26 — End: 2020-04-26
  Administered 2020-04-26: 4 mg via INTRAVENOUS
  Filled 2020-04-26: qty 2

## 2020-04-26 MED ORDER — LACTATED RINGERS IV BOLUS
1000.0000 mL | Freq: Once | INTRAVENOUS | Status: AC
  Administered 2020-04-26: 1000 mL via INTRAVENOUS

## 2020-04-26 MED ORDER — METFORMIN HCL 500 MG PO TABS: 500.0000 mg | ORAL_TABLET | Freq: Two times a day (BID) | ORAL | 0 refills | Status: AC

## 2020-04-26 MED ORDER — CEPHALEXIN 500 MG PO CAPS: 500.0000 mg | ORAL_CAPSULE | Freq: Four times a day (QID) | ORAL | 0 refills | Status: AC

## 2020-04-26 MED ORDER — SODIUM CHLORIDE 0.9 % IV SOLN
1.0000 g | Freq: Once | INTRAVENOUS | Status: AC
  Administered 2020-04-26: 1 g via INTRAVENOUS
  Filled 2020-04-26: qty 10

## 2020-04-26 MED ORDER — ONDANSETRON HCL 4 MG PO TABS: 4.0000 mg | ORAL_TABLET | Freq: Four times a day (QID) | ORAL | 0 refills | Status: AC

## 2020-04-26 NOTE — ED Notes (Signed)
Patient Alert and oriented to baseline. Stable and ambulatory to baseline. Patient verbalized understanding of the discharge instructions.  Patient belongings were taken by the patient.   

## 2020-04-26 NOTE — Discharge Instructions (Signed)
If you start having worsening pain and vomiting despite the medication return to the ER

## 2020-04-26 NOTE — ED Triage Notes (Signed)
Patient complains abdominal pain, emesis, and fever that started on Monday. Denies known sick contacts. Patient alert, oriented, and in no apparent distress at this time.

## 2020-04-26 NOTE — Progress Notes (Signed)
   04/26/20 1119  TOC ED Mini Assessment  TOC Time spent with patient (minutes): 30  PING Used in TOC Assessment No  Admission or Readmission Diverted Yes  Interventions which prevented an admission or readmission Medication Review;Follow-up medical appointment  What brought you to the Emergency Department?  nausea, vomiting, diarrhea  Barriers to Discharge ED Uninsured needing PCP establishment  Barrier interventions [facility] indogent clinic  Means of departure Car   Cire Deyarmin J. Lucretia Roers, RN, BSN, [place] [phone]  RNCM set up appointment with Gwinda Passe, NP on 3/25.  Placed appointment information on AVS and advised to please arrive 15 min early and take a picture ID and your current medications.

## 2020-04-26 NOTE — ED Provider Notes (Signed)
[facility] EMERGENCY DEPARTMENT Provider Note   CSN: [id number] Arrival date & time: 04/26/20  0805     History Chief Complaint  Patient presents with  . Abdominal Pain    Kristen Williams is a 45 y.o. female.  The history is provided by the patient. The history is limited by a language barrier. A language interpreter was used.  Abdominal Pain Pain location:  Periumbilical Pain quality: aching and cramping   Pain radiates to:  Does not radiate Pain severity:  Moderate Onset quality:  Gradual Duration:  3 days Timing:  Constant Progression:  Worsening Chronicity:  New Context comment:  Woke up on monday and was not feeling well Relieved by:  Nothing Worsened by:  Eating Ineffective treatments:  None tried Associated symptoms: anorexia, chills, dysuria, fever, nausea, vaginal discharge and vomiting   Associated symptoms: no cough, no diarrhea, no hematemesis, no hematuria, no shortness of breath and no vaginal bleeding   Associated symptoms comment:  Reports dysuria for the last 3 weeks that has persisted.  Feeling hot and cold.  Started Monday but she was still able to work but symptoms worsened yesterday and vomiting became worse and she is not holding anything down.  Reports feels like when she had to have her gallbadder taken out. Risk factors: multiple surgeries   Risk factors: no alcohol abuse, no NSAID use and not pregnant   Risk factors comment:  Prior hernia repair and cholecystectomy      Past Medical History:  Diagnosis Date  . Broken tooth 09/19/2015  . Chronic mastitis 09/2015   right breast  . Depression    pp depression  . Illiterate   . Leg cramps 09/2015    There are no problems to display for this patient.   Past Surgical History:  Procedure Laterality Date  . CHOLECYSTECTOMY  2006  . INCISION AND DRAINAGE OF WOUND Right 09/20/2015   Procedure: RIGHT BREAST IRRIGATION AND DEBRIDEMENT;  Surgeon: Harriette Bouillon, MD;   Location: [facility];  Service: General;  Laterality: Right;  . UMBILICAL HERNIA REPAIR       OB History    Gravida  4   Para  3   Term  3   Preterm      AB  1   Living  3     SAB  1   IAB      Ectopic      Multiple      Live Births  3           Family History  Problem Relation Age of Onset  . Diabetes Mother   . Diabetes Maternal Grandmother     Social History   Tobacco Use  . Smoking status: Never Smoker  . Smokeless tobacco: Never Used  Substance Use Topics  . Alcohol use: No  . Drug use: No    Home Medications Prior to Admission medications   Medication Sig Start Date End Date Taking? Authorizing Provider  acetaminophen (TYLENOL) 325 MG tablet Take 650 mg by mouth every 6 (six) hours as needed.    [provider]  doxycycline (VIBRAMYCIN) 50 MG capsule Take 1 capsule (50 mg total) by mouth 2 (two) times daily. 09/20/15   Cornett, Maisie Fus, MD  HYDROcodone-acetaminophen (NORCO) 5-325 MG tablet Take 1-2 tablets by mouth every 6 (six) hours as needed for moderate pain. 09/20/15   Cornett, Maisie Fus, MD  ibuprofen (ADVIL,MOTRIN) 200 MG tablet Take 200 mg by mouth every 6 (six)  hours as needed.    [provider]    Allergies    Patient has no known allergies.  Review of Systems   Review of Systems  Constitutional: Positive for chills and fever.  Respiratory: Negative for cough and shortness of breath.   Gastrointestinal: Positive for abdominal pain, anorexia, nausea and vomiting. Negative for diarrhea and hematemesis.  Genitourinary: Positive for dysuria and vaginal discharge. Negative for hematuria and vaginal bleeding.  All other systems reviewed and are negative.   Physical Exam Updated Vital Signs BP (!) 153/103 (BP Location: Right Arm)   Pulse 88   Temp 97.6 F (36.4 C) (Oral)   Resp 16   SpO2 100%   Physical Exam Vitals and nursing note reviewed.  Constitutional:      General: She is not in acute  distress.    Appearance: She is well-developed and normal weight.  HENT:     Head: Normocephalic and atraumatic.  Eyes:     Pupils: Pupils are equal, round, and reactive to light.  Cardiovascular:     Rate and Rhythm: Normal rate and regular rhythm.     Heart sounds: Normal heart sounds. No murmur heard. No friction rub.  Pulmonary:     Effort: Pulmonary effort is normal.     Breath sounds: Normal breath sounds. No wheezing or rales.  Abdominal:     General: Abdomen is flat. Bowel sounds are normal. There is no distension.     Palpations: Abdomen is soft.     Tenderness: There is abdominal tenderness in the periumbilical area. There is no right CVA tenderness, left CVA tenderness, guarding or rebound.  Musculoskeletal:        General: No tenderness. Normal range of motion.     Comments: No edema  Skin:    General: Skin is warm and dry.     Findings: No rash.  Neurological:     Mental Status: She is alert and oriented to person, place, and time. Mental status is at baseline.     Cranial Nerves: No cranial nerve deficit.  Psychiatric:        Mood and Affect: Mood normal.        Behavior: Behavior normal.        Thought Content: Thought content normal.     ED Results / Procedures / Treatments   Labs (all labs ordered are listed, but only abnormal results are displayed) Labs Reviewed  COMPREHENSIVE METABOLIC PANEL - Abnormal; Notable for the following components:      Result Value   Sodium 133 (*)    Potassium 3.3 (*)    CO2 20 (*)    Glucose, Bld 281 (*)    Calcium 8.1 (*)    Total Protein 6.3 (*)    Albumin 3.2 (*)    AST 71 (*)    All other components within normal limits  CBC - Abnormal; Notable for the following components:   WBC 10.9 (*)    All other components within normal limits  URINALYSIS, ROUTINE W REFLEX MICROSCOPIC - Abnormal; Notable for the following components:   APPearance CLOUDY (*)    Specific Gravity, Urine 1.043 (*)    Glucose, UA >=500 (*)     Ketones, ur 80 (*)    Protein, ur 30 (*)    Leukocytes,Ua LARGE (*)    WBC, UA >50 (*)    Bacteria, UA FEW (*)    All other components within normal limits  LIPASE, BLOOD  HEMOGLOBIN A1C  I-STAT BETA HCG BLOOD, ED ([facility], [facility], [facility] ONLY)  TROPONIN I (HIGH SENSITIVITY)  TROPONIN I (HIGH SENSITIVITY)    EKG EKG Interpretation  Date/Time:  Wednesday April 26 2020 08:20:48 EDT Ventricular Rate:  86 PR Interval:    QRS Duration: 95 QT Interval:  378 QTC Calculation: 450 R Axis:   64 Text Interpretation: Sinus rhythm RSR' in V1 or V2, right VCD or RVH Nonspecific T abnormalities, diffuse leads Borderline ST elevation, lateral leads No previous tracing Confirmed by Gwyneth Sprout ([id number]) on 04/26/2020 8:44:41 AM   Radiology No results found.  Procedures Procedures   Medications Ordered in ED Medications  ondansetron (ZOFRAN) injection 4 mg (has no administration in time range)  lactated ringers bolus 1,000 mL (has no administration in time range)    ED Course  I have reviewed the triage vital signs and the nursing notes.  Pertinent labs & imaging results that were available during my care of the patient were reviewed by me and considered in my medical decision making (see chart for details).    MDM Rules/Calculators/A&P                          Patient is a 45 year old female presenting today with a 2-1/2-day history of periumbilical abdominal pain, nausea, fever and chills.  She denies any diarrhea but has noted dysuria for the last 3 weeks.  She complains of some mild vaginal discharge but no itching or burning.  She has not had any blood in her stool or emesis.  It for the last 24 hours she reports being unable to hold anything down and significant yellow emesis.  On exam patient has periumbilical pain but no rebound or guarding.  She is nontoxic-appearing.  Given history of dysuria for 3 weeks concern for pyelonephritis versus viral etiology versus pancreatitis or hepatitis.   She is not having lower abdominal pain concerning for diverticulitis or appendicitis at this time.  Labs are pending.  IV fluids in antiemetics given.  Patient's EKG does show some nonspecific ST and T wave changes without old to compare.  She denies any chest pain or shortness of breath. Will do a trop for screening.  11:05 AM Patient's troponin is within normal limits, hCG is negative, CBC with mild leukocytosis of 10.9, CMP with hyperglycemia of 281, potassium of 3.3 and mild elevation of AST of 71.  Anion gap of 10, lipase within normal limits, UA with 80 ketones, large leukocytes, red blood cells and greater than 50 white cells with few bacteria.  In the setting of patient having dysuria for 3 weeks feel that patient symptoms are most likely related to UTI versus pyelonephritis.  Patient on reevaluation after getting a liter of fluid and Zofran is feeling much better.  She was given Rocephin.  She does not have a PCP and reports she was only diabetic when she was pregnant.  We will send an A1c but patient was also given a prescription for Metformin.  Transitional care team to see the patient for PCP follow-up.  MDM Number of Diagnoses or Management Options   Amount and/or Complexity of Data Reviewed Clinical lab tests: ordered and reviewed Tests in the medicine section of CPT: ordered and reviewed Independent visualization of images, tracings, or specimens: yes     Final Clinical Impression(s) / ED Diagnoses Final diagnoses:  Urinary tract infection with hematuria, site unspecified  Hyperglycemia    Rx / DC Orders ED Discharge Orders  Ordered    cephALEXin (KEFLEX) 500 MG capsule  4 times daily        04/26/20 1212    metFORMIN (GLUCOPHAGE) 500 MG tablet  2 times daily with meals        04/26/20 1212    ondansetron (ZOFRAN) 4 MG tablet  Every 6 hours        04/26/20 1212           Gwyneth Sprout, MD 04/26/20 1231

## 2020-04-28 LAB — URINE CULTURE

## 2020-05-03 ENCOUNTER — Inpatient Hospital Stay (INDEPENDENT_AMBULATORY_CARE_PROVIDER_SITE_OTHER): Payer: Self-pay | Admitting: Primary Care

## 2020-08-16 ENCOUNTER — Other Ambulatory Visit: Payer: Self-pay

## 2020-08-16 DIAGNOSIS — N644 Mastodynia: Secondary | ICD-10-CM

## 2020-08-16 DIAGNOSIS — N6452 Nipple discharge: Secondary | ICD-10-CM

## 2020-09-05 ENCOUNTER — Other Ambulatory Visit: Payer: Self-pay | Admitting: Obstetrics and Gynecology

## 2020-09-05 ENCOUNTER — Ambulatory Visit: Payer: No Typology Code available for payment source | Admitting: *Deleted

## 2020-09-05 ENCOUNTER — Ambulatory Visit
Admission: RE | Admit: 2020-09-05 | Discharge: 2020-09-05 | Disposition: A | Discharge status: Home / Self Care | Payer: No Typology Code available for payment source | Source: Ambulatory Visit | Attending: Obstetrics and Gynecology | Admitting: Obstetrics and Gynecology

## 2020-09-05 ENCOUNTER — Other Ambulatory Visit: Payer: Self-pay

## 2020-09-05 VITALS — BP 132/84 | Wt 134.0 lb

## 2020-09-05 DIAGNOSIS — N6452 Nipple discharge: Secondary | ICD-10-CM

## 2020-09-05 DIAGNOSIS — Z1239 Encounter for other screening for malignant neoplasm of breast: Secondary | ICD-10-CM

## 2020-09-05 DIAGNOSIS — N644 Mastodynia: Secondary | ICD-10-CM

## 2020-09-05 DIAGNOSIS — N6325 Unspecified lump in the left breast, overlapping quadrants: Secondary | ICD-10-CM

## 2020-09-05 NOTE — Progress Notes (Signed)
Ms. Kristen Williams is a 45 y.o. female who presents to [facility] clinic today with complaint of bilateral breast pain, breast discharge, and bilateral breast lumps. Patient complained of left breast pain x 1.5 years around her nipple. Patient states the pain comes and goes. Patient rates the pain at a 7-8 out of 10. Patient complained of right outer breast pain x 1.5 years that comes and goes. Patient rates the pain at a 8 out of 10. Patient complained of a spontaneous clear to milky discharge from her left beast x 7 years.     Pap Smear: Pap smear not completed today. Last Pap smear was 07/19/2020 at the [facility] clinic and was normal per patient. Per patient has no history of an abnormal Pap smear. Last Pap smear result is not available in Epic.   Physical exam: Breasts Breasts symmetrical. No skin abnormalities left breast. Observed a scar right upper breast from history of lumpectomy for benign reasons. Nipple retraction bilateral breasts that per patient is normal for her. Observed a whitish colored dried up discharge on left nipple. Unable to express any nipple discharge from bilateral breasts on exam. No nipple discharge bilateral breasts. No lymphadenopathy. No lumps palpated right breast. Palpated a lump within the left breast at 12 o'clock 1 cm from the nipple. Complaints of bilateral outer breast pain on exam.  Pelvic/Bimanual Pap is not indicated today per BCCCP guidelines.    Smoking History: Patient has never smoked.   Patient Navigation: Patient education provided. Access to services provided for patient through [place] program. Spanish interpreter Natale Lay from [facility] provided.    Breast and Cervical Cancer Risk Assessment: Patient does not have family history of breast cancer, known genetic mutations, or radiation treatment to the chest before age 75. Patient does not have history of cervical dysplasia, immunocompromised, or DES exposure  in-utero.  Risk Assessment     Risk Scores       09/05/2020   Last edited by: Narda Rutherford, LPN   5-year risk: 1.4 %   Lifetime risk: 13.4 %            A: BCCCP exam without pap smear Complaint of bilateral breast pain, lumps, and discharge.  P: Referred patient to the [facility] for a diagnostic mammogram. Appointment scheduled Tuesday, September 05, 2020 at 1030.  Priscille Heidelberg, RN 09/05/2020 9:16 AM

## 2020-09-05 NOTE — Patient Instructions (Signed)
Explained breast self awareness with [name]. Patient did not need a Pap smear today due to last Pap smear was 07/19/2020 per patient. Let her know BCCCP will cover Pap smears every 3 years unless has a history of abnormal Pap smears. Referred patient to the [facility] for a diagnostic mammogram. Appointment scheduled Tuesday, September 05, 2020 at 1030. Patient aware of appointment and will be there. Kristen Williams verbalized understanding.  Anders Hohmann, Kathaleen Maser, RN 9:17 AM
